# Patient Record
Sex: Female | Born: 1960 | Race: Black or African American | Hispanic: No | Marital: Married | Smoking: Never smoker
Health system: Southern US, Community
[De-identification: ages and names within clinical notes are randomized; demographics above are authoritative.]

## PROBLEM LIST (undated history)

## (undated) DIAGNOSIS — C801 Malignant (primary) neoplasm, unspecified: Secondary | ICD-10-CM

## (undated) DIAGNOSIS — T7840XA Allergy, unspecified, initial encounter: Secondary | ICD-10-CM

## (undated) HISTORY — DX: Allergy, unspecified, initial encounter: T78.40XA

## (undated) HISTORY — DX: Malignant (primary) neoplasm, unspecified: C80.1

---

## 2008-09-25 ENCOUNTER — Emergency Department (HOSPITAL_BASED_OUTPATIENT_CLINIC_OR_DEPARTMENT_OTHER): Admission: EM | Admit: 2008-09-25 | Discharge: 2008-09-25 | Payer: Self-pay | Admitting: Emergency Medicine

## 2014-08-26 HISTORY — PX: BREAST SURGERY: SHX581

## 2014-12-05 ENCOUNTER — Encounter: Payer: Self-pay | Admitting: Physical Therapy

## 2014-12-05 ENCOUNTER — Ambulatory Visit: Payer: Medicaid Other | Attending: Hematology and Oncology | Admitting: Physical Therapy

## 2014-12-05 DIAGNOSIS — M25612 Stiffness of left shoulder, not elsewhere classified: Secondary | ICD-10-CM

## 2014-12-05 DIAGNOSIS — T7840XA Allergy, unspecified, initial encounter: Secondary | ICD-10-CM | POA: Insufficient documentation

## 2014-12-05 DIAGNOSIS — I890 Lymphedema, not elsewhere classified: Secondary | ICD-10-CM | POA: Diagnosis present

## 2014-12-05 DIAGNOSIS — M25622 Stiffness of left elbow, not elsewhere classified: Secondary | ICD-10-CM | POA: Diagnosis not present

## 2014-12-05 NOTE — Therapy (Signed)
[place], [place], [address] Phone: [phone]   Fax:  [phone]  Physical Therapy Evaluation  Patient Details  Name: Tiffany Henry MRN: [id number] Date of Birth: November 10, 1961  Encounter Date: 12/05/2014      PT End of Session - 12/05/14 1026    Visit Number 1   Number of Visits 4   PT Start Time 0935   PT Stop Time 1020   PT Time Calculation (min) 45 min   Activity Tolerance Other (comment)  Did okay, but not feeling well from chemo yesterday      Past Medical History  Diagnosis Date  . Cancer   . Allergy     sulfates    Past Surgical History  Procedure Laterality Date  . Breast surgery  08/26/14    lumpectomy    There were no vitals taken for this visit.  Visit Diagnosis:  Lymphedema of upper extremity - Plan: PT plan of care cert/re-cert  Stiffness of shoulder joint, left - Plan: PT plan of care cert/re-cert  Stiffness of elbow joint, left - Plan: PT plan of care cert/re-cert      Subjective Assessment - 12/05/14 0944    Symptoms Swelling in left arm, limited ROM and use of left arm.   Pertinent History Left breast cancer with lumpectomy 08/26/14; currently on chemotherapy (on 3rd of 8 rounds) and will have radiation.   Currently in Pain? No/denies          [facility] PT Assessment - 12/05/14 0001    Assessment   Medical Diagnosis left breast cancer   Precautions   Precautions Other (comment)  cancer precautions; current chemotherapy   Restrictions   Weight Bearing Restrictions No   Balance Screen   Has the patient fallen in the past 6 months No   Has the patient had a decrease in activity level because of a fear of falling?  No   Is the patient reluctant to leave their home because of a fear of falling?  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   AROM   Overall AROM  Deficits   Right Shoulder Extension 60 Degrees   Right Shoulder Flexion 173 Degrees   Right Shoulder ABduction 180 Degrees   Right Shoulder Internal Rotation 70 Degrees   Right Shoulder External Rotation 90 Degrees   Left Shoulder Extension 45 Degrees   Left Shoulder Flexion 96 Degrees   Left Shoulder ABduction 88 Degrees   Left Shoulder Internal Rotation 52 Degrees   Left Shoulder External Rotation 65 Degrees  approx. in sitting   Left Elbow Flexion --  WNL   Left Elbow Extension -40  visible and palpable cording at left elbow with extension           LYMPHEDEMA/ONCOLOGY QUESTIONNAIRE - 12/05/14 0957    Right Upper Extremity Lymphedema  15 cm Proximal to Olecranon Process 36 cm   10 cm Proximal to Olecranon Process 34.3 cm   Olecranon Process 28.2 cm   15 cm Proximal to Ulnar Styloid Process 28 cm   10 cm Proximal to Ulnar Styloid Process 24.6 cm   Just Proximal to Ulnar Styloid Process 18.5 cm   Across Hand at [employer] 21.3 cm   At [place] of 2nd Digit 6.8 cm   Left Upper Extremity Lymphedema   15 cm Proximal to Olecranon Process 36.5 cm   10 cm Proximal to Olecranon Process 35 cm   Olecranon Process 28.6 cm   15 cm Proximal to Ulnar Styloid Process 28.8 cm   10 cm Proximal to Ulnar Styloid Process 25 cm   Just Proximal to Ulnar Styloid Process 17.9 cm   Across Hand at [employer] 21.6 cm   At [place] of 2nd Digit 6.9 cm            Quick Dash - 12/05/14 0001    Open a tight or new jar Moderate difficulty   Carry a shopping bag or briefcase Moderate difficulty   Wash your back Unable   Use a knife to cut food Moderate difficulty   Recreational activities in which you take some force or impact through your arm, shoulder, or hand (golf, hammering, tennis) Unable   During the past week, to what extent has your arm, shoulder or hand problem interfered with your normal social activities with family, friends, neighbors, or groups? Modererately   During the past week, to what extent has your arm, shoulder or hand problem limited your work or other regular daily activities Modererately   Arm, shoulder, or hand pain. Moderate   Tingling (pins and needles) in your arm, shoulder, or hand Mild   Difficulty Sleeping Moderate difficulty   DASH Score 50 %                    PT Education - 12/05/14 1026    Education provided Yes   Education Details lymphedema brochure from [facility]; lymphedema risk reduction practices   Person(s) Educated Patient   Methods Handout   Comprehension Need further instruction           Short Term Clinic Goals - 12/05/14 1032    CC Short Term Goal  #1   Title see long term goals             Long Term Clinic Goals - 12/05/14 1032    CC Long Term Goal  #1   Title Pt. will be knowledgeable about lymphedema risk reduction practices, including obtaining compression garments.   Baseline no knowledge   Time 4   Period Weeks   Status New   CC Long Term Goal  #2   Title Pt. will be independent in self-manual lymph drainage.   Baseline no knowledge   Time 4   Period Weeks   Status New   CC Long Term Goal  #3   Title Pt. will be independent in HEP for left UE ROM and stretching of axillary web syndrome.   Baseline no knowledge and with significant AROM and functional limitations   Time 4   Period Weeks   Status New            Plan -  12/05/14 1027    Clinical Impression Statement Pt. s/p lumpectomy and currently in chemotherapy for left breast cancer with appearance of beginning of lymphedema  left UE; also with limited shoulder and elbow AROM that side, and visible cording at left elbow with extension.   Pt will benefit from skilled therapeutic intervention in order to improve on the following deficits Increased edema;Decreased range of motion;Decreased knowledge of precautions   Rehab Potential Excellent   Clinical Impairments Affecting Rehab Potential undergoing chemotherapy so may not feel well at times   PT Frequency Other (comment)  3 visits   PT Duration 4 weeks   PT Treatment/Interventions Manual lymph drainage;Patient/family education;ADLs/Self Care Home Management;Therapeutic exercise;Passive range of motion   PT Next Visit Plan Do manual lymph drainage for left UE and begin instruction in self-massage; left shoulder and elbow ROM and HEP.  Discuss compression garments.  Pt. is also expected to attend ABC class 12/18/14.   Recommended Other Services ABC class 12/18/14.   Consulted and Agree with Plan of Care Patient     Daytime sleeve and glove and nighttime compression garment prescription was faxed to Dr. Harlow Asa for signature today.    Problem List Patient Active Problem List   Diagnosis Date Noted  . Allergy     Aarionna Germer 12/05/2014, 10:47 AM  [place], [place], [address] Phone: [phone]   Fax:  [phone]   Serafina Royals, [place]

## 2014-12-05 NOTE — Therapy (Signed)
[place], [place], [address] Phone: [phone]   Fax:  [phone]  Physical Therapy Evaluation  Patient Details  Name: Tiffany Henry MRN: [id number] Date of Birth: [birth year]  Encounter Date: 12/05/2014      PT End of Session - 12/05/14 1026    Visit Number 1   Number of Visits 4   PT Start Time 0935   PT Stop Time 1020   PT Time Calculation (min) 45 min   Activity Tolerance Other (comment)  Did okay, but not feeling well from chemo yesterday      Past Medical History  Diagnosis Date  . Cancer   . Allergy     sulfates    Past Surgical History  Procedure Laterality Date  . Breast surgery  08/26/14    lumpectomy    There were no vitals taken for this visit.  Visit Diagnosis:  Lymphedema of upper extremity - Plan: PT plan of care cert/re-cert  Stiffness of shoulder joint, left - Plan: PT plan of care cert/re-cert  Stiffness of elbow joint, left - Plan: PT plan of care cert/re-cert      Subjective Assessment - 12/05/14 0944    Symptoms Swelling in left arm, limited ROM and use of left arm.   Pertinent History Left breast cancer with lumpectomy 08/26/14; currently on chemotherapy (on 3rd of 8 rounds) and will have radiation.   Currently in Pain? No/denies          [facility] PT Assessment - 12/05/14 0001    Assessment   Medical Diagnosis left breast cancer   Precautions   Precautions Other (comment)  cancer precautions; current chemotherapy   Restrictions   Weight Bearing Restrictions No   Balance Screen   Has the patient fallen in the past 6 months No   Has the patient had a decrease in activity level because of a fear of falling?  No   Is the patient reluctant to leave their home because of a fear of falling?  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   AROM   Overall AROM  Deficits   Right Shoulder Extension 60 Degrees   Right Shoulder Flexion 173 Degrees   Right Shoulder ABduction 180 Degrees   Right Shoulder Internal Rotation 70 Degrees   Right Shoulder External Rotation 90 Degrees   Left Shoulder Extension 45 Degrees   Left Shoulder Flexion 96 Degrees   Left Shoulder ABduction 88 Degrees   Left Shoulder Internal Rotation 52 Degrees   Left Shoulder External Rotation 65 Degrees  approx. in sitting   Left Elbow Flexion --  WNL   Left Elbow Extension -40  visible and palpable cording at left elbow with extension           LYMPHEDEMA/ONCOLOGY QUESTIONNAIRE - 12/05/14 0957    Right Upper Extremity Lymphedema  15 cm Proximal to Olecranon Process 36 cm   10 cm Proximal to Olecranon Process 34.3 cm   Olecranon Process 28.2 cm   15 cm Proximal to Ulnar Styloid Process 28 cm   10 cm Proximal to Ulnar Styloid Process 24.6 cm   Just Proximal to Ulnar Styloid Process 18.5 cm   Across Hand at [employer] 21.3 cm   At [place] of 2nd Digit 6.8 cm   Left Upper Extremity Lymphedema   15 cm Proximal to Olecranon Process 36.5 cm   10 cm Proximal to Olecranon Process 35 cm   Olecranon Process 28.6 cm   15 cm Proximal to Ulnar Styloid Process 28.8 cm   10 cm Proximal to Ulnar Styloid Process 25 cm   Just Proximal to Ulnar Styloid Process 17.9 cm   Across Hand at [employer] 21.6 cm   At [place] of 2nd Digit 6.9 cm            Quick Dash - 12/05/14 0001    Open a tight or new jar Moderate difficulty   Carry a shopping bag or briefcase Moderate difficulty   Wash your back Unable   Use a knife to cut food Moderate difficulty   Recreational activities in which you take some force or impact through your arm, shoulder, or hand (golf, hammering, tennis) Unable   During the past week, to what extent has your arm, shoulder or hand problem interfered with your normal social activities with family, friends, neighbors, or groups? Modererately   During the past week, to what extent has your arm, shoulder or hand problem limited your work or other regular daily activities Modererately   Arm, shoulder, or hand pain. Moderate   Tingling (pins and needles) in your arm, shoulder, or hand Mild   Difficulty Sleeping Moderate difficulty   DASH Score 50 %                    PT Education - 12/05/14 1026    Education provided Yes   Education Details lymphedema brochure from [facility]; lymphedema risk reduction practices   Person(s) Educated Patient   Methods Handout   Comprehension Need further instruction           Short Term Clinic Goals - 12/05/14 1032    CC Short Term Goal  #1   Title see long term goals             Long Term Clinic Goals - 12/05/14 1032    CC Long Term Goal  #1   Title Pt. will be knowledgeable about lymphedema risk reduction practices, including obtaining compression garments.   Baseline no knowledge   Time 4   Period Weeks   Status New   CC Long Term Goal  #2   Title Pt. will be independent in self-manual lymph drainage.   Baseline no knowledge   Time 4   Period Weeks   Status New   CC Long Term Goal  #3   Title Pt. will be independent in HEP for left UE ROM and stretching of axillary web syndrome.   Baseline no knowledge and with significant AROM and functional limitations   Time 4   Period Weeks   Status New            Plan -  12/05/14 1027    Clinical Impression Statement Pt. s/p lumpectomy and currently in chemotherapy for left breast cancer with appearance of beginning of lymphedema  left UE; also with limited shoulder and elbow AROM that side, and visible cording at left elbow with extension.   Pt will benefit from skilled therapeutic intervention in order to improve on the following deficits Increased edema;Decreased range of motion;Decreased knowledge of precautions   Rehab Potential Excellent   Clinical Impairments Affecting Rehab Potential undergoing chemotherapy so may not feel well at times   PT Frequency Other (comment)  3 visits   PT Duration 4 weeks   PT Treatment/Interventions Manual lymph drainage;Patient/family education;ADLs/Self Care Home Management;Therapeutic exercise;Passive range of motion   PT Next Visit Plan Do manual lymph drainage for left UE and begin instruction in self-massage; left shoulder and elbow ROM and HEP.  Discuss compression garments.  Pt. is also expected to attend ABC class 12/18/14.   Recommended Other Services ABC class 12/18/14.   Consulted and Agree with Plan of Care Patient         Problem List Patient Active Problem List   Diagnosis Date Noted  . Allergy     Siriah Treat 12/05/2014, 10:41 AM  [place], [place], [address] Phone: [phone]   Fax:  [phone]   Serafina Royals, [place]

## 2014-12-14 ENCOUNTER — Ambulatory Visit: Payer: Medicaid Other | Admitting: Physical Therapy

## 2014-12-14 DIAGNOSIS — I890 Lymphedema, not elsewhere classified: Secondary | ICD-10-CM | POA: Diagnosis not present

## 2014-12-14 DIAGNOSIS — M25612 Stiffness of left shoulder, not elsewhere classified: Secondary | ICD-10-CM

## 2014-12-14 DIAGNOSIS — M25622 Stiffness of left elbow, not elsewhere classified: Secondary | ICD-10-CM

## 2014-12-14 NOTE — Therapy (Signed)
[place], [place], [address] Phone: [phone]   Fax:  [phone]  Physical Therapy Treatment  Patient Details  Name: Tiffany Henry MRN: [id number] Date of Birth: 1961-08-26  Encounter Date: 12/14/2014      PT End of Session - 12/14/14 1221    Visit Number 2   Number of Visits 4   PT Start Time 8242   PT Stop Time 1147   PT Time Calculation (min) 42 min      Past Medical History  Diagnosis Date  . Cancer   . Allergy     sulfates    Past Surgical History  Procedure Laterality Date  . Breast surgery  08/26/14    lumpectomy    There were no vitals taken for this visit.  Visit Diagnosis:  Lymphedema of upper extremity  Stiffness of shoulder joint, left  Stiffness of elbow joint, left      Subjective Assessment - 12/14/14 1212    Symptoms Pain in left medial eblow with cording palpable.  Pain goes down into hand at 5th finger side and also at thenar space.   Currently in Pain? Yes  no pain at rest, but much pain with range of motion of left arm   Pain Score --  did not rate....lots of facial grimacing   Pain Location Hand   Pain Orientation Left;Medial   Pain Descriptors / Indicators Sharp;Shooting;Tightness   Pain Type Acute pain   Pain Radiating Towards hand   Pain Onset 1 to 4 weeks ago   Pain Frequency Intermittent     Manual lymph drainage in supine as follows: short neck, right axillary nodes, left inguinal nodes, superficial and deep abdominals; anterior inter-axillary anastamoses, left axillo-inguinal anastamoses. Left upper extremity from fingers and dorsal hand to lateral shoulder redirecting along pathways.  AAROM to left shoulder in conventional and diagonal patterns incorporating ROM of wrist and forearm as able Pt unable to tolerate much range of [place] of elbow, but worked on pronation and supination as well as wrist, hand and finger ROM Also did backward  shoulder rolls in sitting         Short Term Clinic Goals - 12/05/14 1032    CC Short Term Goal  #1   Title see long term goals             Long Term Clinic Goals - 12/14/14 1226    CC Long Term Goal  #1   Title Pt. will be knowledgeable about lymphedema risk reduction practices, including obtaining compression garments.   Time 4   Period Weeks   Status On-going   CC Long Term Goal  #2   Title Pt. will be independent in self-manual lymph drainage.   Time 4   Period Weeks   Status On-going   CC Long Term Goal  #3   Title Pt. will be independent in HEP for left UE ROM and stretching of axillary web syndrome.   Time 4   Status On-going            Plan - 12/14/14 1222    Clinical Impression Statement Pt continues to have painful cording and edema in left arm.  She was able to tolerate manual lymph drainage and participate with exercise. She appeared to tolerate Tg soft well.   PT Next Visit Plan Continue with manual lymph drainage and exercise.  Review self manual lymph drainage.  Initiate compression garments from [employer] if  tolerating Tg  Soft OK        Problem List Patient Active Problem List   Diagnosis Date Noted  . Allergy    Donato Heinz. Owens Shark PT   Norwood Levo 12/14/2014, 12:27 PM  [place], [place], [address] Phone: [phone]   Fax:  [phone]

## 2014-12-14 NOTE — Patient Instructions (Addendum)
AROM of left shoulder, forearm, hand and fingers Supine cane exercises Pt issued tgSoft to left arm to wear for symptomatic relief as needed Adela Lank Training DVD for pt to watch and practice self manual lymph drainage at home over the weekend She knows to return it

## 2014-12-19 ENCOUNTER — Ambulatory Visit: Payer: Medicaid Other | Attending: Hematology and Oncology | Admitting: Physical Therapy

## 2014-12-19 DIAGNOSIS — M25622 Stiffness of left elbow, not elsewhere classified: Secondary | ICD-10-CM | POA: Insufficient documentation

## 2014-12-19 DIAGNOSIS — M25612 Stiffness of left shoulder, not elsewhere classified: Secondary | ICD-10-CM | POA: Insufficient documentation

## 2014-12-19 DIAGNOSIS — I890 Lymphedema, not elsewhere classified: Secondary | ICD-10-CM | POA: Insufficient documentation

## 2014-12-19 NOTE — Patient Instructions (Signed)
Inferior Capsule Stick Stretch I   Stand or sit, dowel in palm of arm to be stretched. Other arm, holding dowel in front of body, pushes outward and upward until arm being stretched is as high to the side as possible. Hold __5_ seconds. Repeat _5-8__ times per session. Do _2__ sessions per day.  Copyright  VHI. All rights reserved.  Flexors Stick Stretch I   Stand or sit, dowel in palm of arm to be stretched. Other arm, holding dowel at side and behind body, pushes arm being stretched forward and upward until straight over head. Hold _5__ seconds. Repeat __5-8_ times per session. Do _2__ sessions per day.  Copyright  VHI. All rights reserved.  Internal Rotator Cuff Stick Stretch III, Standing (Passive)   Stand or sit, dowel in palm of arm to be stretched. Other arm, holding dowel at side and behind body, pushes in a large diagonal arc upward and backward until arm being stretched is in 90/90 of shoulder abduction and elbow flexion. Hold __5_ seconds. Repeat __5-8_ times per session. Do _2__ sessions per day.  Copyright  VHI. All rights reserved.

## 2014-12-19 NOTE — Therapy (Addendum)
[place], [place], [address] Phone: [phone]   Fax:  [phone]  Physical Therapy Treatment  Patient Details  Name: Tiffany Henry MRN: [id number] Date of Birth: 09-25-1961  Encounter Date: 12/19/2014    Past Medical History  Diagnosis Date  . Cancer   . Allergy     sulfates    Past Surgical History  Procedure Laterality Date  . Breast surgery  08/26/14    lumpectomy    There were no vitals taken for this visit.  Visit Diagnosis:  Lymphedema of upper extremity  Stiffness of shoulder joint, left  Stiffness of elbow joint, left    Cut and fitted new TG soft sleeve, one that is snugger, and instructed in its use.  Reviewed HEP she is currently doing:  Diona Foley squeezes for hand; using dowel for bilateral shoulder flexion; rotating left shoulder and abducting arm.  Added unilateral left shoulder flexion and abduction with dowel as well as left shoulder abduction with external rotation 5 count holds x 5 each.  Educated patient about lymphedema risk reduction practices using National [employer]ymp[employer]ema [employer] guidelines handout.  Re-faxed compression garment prescription to Dr. Harlow Asa.                     Short Term Clinic Goals - 12/05/14 1032    CC Short Term Goal  #1   Title see long term goals             Long Term Clinic Goals - 12/19/14 1722    CC Long Term Goal  #1   Status On-going   CC Long Term Goal  #2   Status On-going   CC Long Term Goal  #3   Status On-going            Problem List Patient Active Problem List   Diagnosis Date Noted  . Allergy     Janell Keeling 02/21/2016, 9:45 PM  [place], [place], [address] Phone: [phone]   Fax:  [phone]  [place], [place] SUMMARY  Visits from Start of Care: 3  Current functional  level related to goals / functional outcomes: Goals were partially met.  Patient did not return for final visit.   Remaining deficits: Probably none.  Plan was for patient to return for one more visit to firm up her knowledge and independence, but she did not come for that.   Education / Equipment: HEP, self-care.  Plan: Patient agrees to discharge.  Patient goals were partially met. Patient is being discharged due to not returning since the last visit.  ?????    Serafina Royals, PT 02/21/2016 9:45 PM

## 2014-12-20 ENCOUNTER — Ambulatory Visit: Payer: Medicaid Other

## 2015-01-01 ENCOUNTER — Encounter: Payer: Medicaid Other | Admitting: Physical Therapy

## 2015-01-03 ENCOUNTER — Ambulatory Visit: Payer: Medicaid Other | Admitting: Physical Therapy

## 2015-01-08 ENCOUNTER — Ambulatory Visit: Payer: Medicaid Other | Admitting: Physical Therapy

## 2015-01-10 ENCOUNTER — Ambulatory Visit: Payer: Medicaid Other | Admitting: Physical Therapy

## 2015-05-28 DIAGNOSIS — C50412 Malignant neoplasm of upper-outer quadrant of left female breast: Secondary | ICD-10-CM | POA: Insufficient documentation

## 2015-08-22 DIAGNOSIS — Z5181 Encounter for therapeutic drug level monitoring: Secondary | ICD-10-CM | POA: Insufficient documentation

## 2015-08-22 DIAGNOSIS — Z79810 Long term (current) use of selective estrogen receptor modulators (SERMs): Secondary | ICD-10-CM

## 2018-12-12 ENCOUNTER — Other Ambulatory Visit: Payer: Self-pay

## 2018-12-12 ENCOUNTER — Emergency Department (HOSPITAL_BASED_OUTPATIENT_CLINIC_OR_DEPARTMENT_OTHER): Payer: BLUE CROSS/BLUE SHIELD

## 2018-12-12 ENCOUNTER — Encounter (HOSPITAL_BASED_OUTPATIENT_CLINIC_OR_DEPARTMENT_OTHER): Payer: Self-pay | Admitting: Emergency Medicine

## 2018-12-12 ENCOUNTER — Emergency Department (HOSPITAL_BASED_OUTPATIENT_CLINIC_OR_DEPARTMENT_OTHER)
Admission: EM | Admit: 2018-12-12 | Discharge: 2018-12-12 | Disposition: A | Discharge status: Home / Self Care | Payer: BLUE CROSS/BLUE SHIELD | Attending: Emergency Medicine | Admitting: Emergency Medicine

## 2018-12-12 DIAGNOSIS — Z79899 Other long term (current) drug therapy: Secondary | ICD-10-CM | POA: Diagnosis not present

## 2018-12-12 DIAGNOSIS — Z7984 Long term (current) use of oral hypoglycemic drugs: Secondary | ICD-10-CM | POA: Insufficient documentation

## 2018-12-12 DIAGNOSIS — M25561 Pain in right knee: Secondary | ICD-10-CM | POA: Diagnosis present

## 2018-12-12 MED ORDER — HYDROCODONE-ACETAMINOPHEN 5-325 MG PO TABS: 1.0000 | ORAL_TABLET | Freq: Four times a day (QID) | ORAL | 0 refills | Status: DC | PRN

## 2018-12-12 MED ORDER — HYDROCODONE-ACETAMINOPHEN 5-325 MG PO TABS
1.0000 | ORAL_TABLET | Freq: Once | ORAL | Status: AC
  Administered 2018-12-12: 1 via ORAL
  Filled 2018-12-12: qty 1

## 2018-12-12 NOTE — Discharge Instructions (Addendum)

## 2018-12-12 NOTE — ED Notes (Addendum)
Pt states she was walking up stairs at church at approx 1030 today, and when she stepped up with her righ leg it felt weak and gave out. Pt denies twisting leg. She states she had gotten an xray yesterday related to bilateral knee pain. She states she has been walking approx 3 times /week and has noticed an increase in her leg/knee discomfort since walking. Pt denies taking any medications for pain today

## 2018-12-12 NOTE — ED Triage Notes (Signed)
Has had R knee pain for several days, she had an xray at her dr yesterday. Today she was stepping up onto the curb when she felt a pop and now cannot put weight on it.

## 2018-12-12 NOTE — ED Provider Notes (Signed)
[facility] EMERGENCY DEPARTMENT Provider Note   CSN: [id number] Arrival date & time: 12/12/18  1209     History   Chief Complaint Chief Complaint  Patient presents with  . Knee Pain    HPI Tiffany Henry is a 56 y.o. female who presents today for evaluation of right-sided knee pain.  She reports that her knee has been hurting for the past few weeks.  Today she was at church and she stepped up with her right leg and felt a pop and says that it gave out.  She has not taken any medicines for pain today.  She says that it hurts too much to put weight on her knee since she felt a pop.  She denies any numbness or tingling.  HPI  Past Medical History:  Diagnosis Date  . Allergy    sulfates  . Cancer [facility])     Patient Active Problem List   Diagnosis Date Noted  . Allergy     Past Surgical History:  Procedure Laterality Date  . BREAST SURGERY  08/26/14   lumpectomy     OB History   No obstetric history on file.      Home Medications    Prior to Admission medications   Medication Sig Start Date End Date Taking? Authorizing Provider  alendronate (FOSAMAX) 70 MG tablet Take by mouth. 09/12/18  Yes [provider]  empagliflozin (JARDIANCE) 10 MG TABS tablet  10/20/18  Yes [provider]  exemestane (AROMASIN) 25 MG tablet Take by mouth. 10/22/18  Yes [provider]  letrozole ([place]) 2.5 MG tablet TAKE ONE TABLET BY MOUTH ONCE DAILY 08/06/17  Yes [provider]  metFORMIN (GLUCOPHAGE) 1000 MG tablet TAKE 1 TABLET BY MOUTH TWICE DAILY 09/12/18  Yes [provider]  HYDROcodone-acetaminophen (NORCO/VICODIN) 5-325 MG tablet Take 1 tablet by mouth every 6 (six) hours as needed for severe pain. 12/12/18   Lorin Glass, PA-C  ondansetron (ZOFRAN-ODT) 8 MG disintegrating tablet Take 8 mg by mouth every 8 (eight) hours as needed for nausea or vomiting.    [provider]  prochlorperazine (COMPAZINE) 10 MG  tablet Take 10 mg by mouth every 6 (six) hours as needed for nausea or vomiting.    [provider]    Family History No family history on file.  Social History Social History   Tobacco Use  . Smoking status: Never Smoker  . Smokeless tobacco: Never Used  Substance Use Topics  . Alcohol use: Not Currently  . Drug use: Not on file     Allergies   Sulfa antibiotics   Review of Systems Review of Systems  Constitutional: Negative for chills and fever.  Musculoskeletal: Negative for back pain.       Pain in right knee.   Neurological: Negative for weakness and numbness.  All other systems reviewed and are negative.    Physical Exam Updated Vital Signs BP (!) 146/80 (BP Location: Right Arm)   Pulse 70   Temp 98.4 F (36.9 C) (Oral)   Resp 16   Ht 5\' 4"  (1.626 m)   Wt 95.3 kg   SpO2 98%   BMI 36.05 kg/m   Physical Exam Vitals signs and nursing note reviewed.  Constitutional:      General: She is not in acute distress.    Appearance: She is not ill-appearing.  HENT:     Head: Normocephalic.  Cardiovascular:     Rate and Rhythm: Normal rate.  Comments: Right foot 2+ DP/PT pulse. Pulmonary:     Effort: Pulmonary effort is normal. No respiratory distress.  Musculoskeletal:     Comments: Right knee has mild tenderness to palpation over the medial aspect.  There is no obvious joint effusion.  Range of motion limited secondary to pain.  There is no tenderness to palpation over right posterior calf.    Skin:    Comments: No abnormal redness, warmth, or wounds present over right knee.  Neurological:     Mental Status: She is alert.     Comments: Awake and alert, interacts appropriately. Sensation intact to right foot.      ED Treatments / Results  Labs (all labs ordered are listed, but only abnormal results are displayed) Labs Reviewed - No data to display  EKG None  Radiology Dg Knee Complete 4 Views Right  Result Date: 12/12/2018 CLINICAL  DATA:  Patient with injury to the right knee. Unable to bear weight EXAM: RIGHT KNEE - COMPLETE 4+ VIEW COMPARISON:  None. FINDINGS: Normal anatomic alignment. No evidence for acute fracture or dislocation. Regional soft tissues unremarkable. IMPRESSION: No acute osseous abnormality. Electronically Signed   By: Lovey Newcomer M.D.   On: 12/12/2018 13:03    Procedures Procedures (including critical care time)  Medications Ordered in ED Medications  HYDROcodone-acetaminophen (NORCO/VICODIN) 5-325 MG per tablet 1 tablet (1 tablet Oral Given 12/12/18 1454)     Initial Impression / Assessment and Plan / ED Course  I have reviewed the triage vital signs and the nursing notes.  Pertinent labs & imaging results that were available during my care of the patient were reviewed by me and considered in my medical decision making (see chart for details).    [place] Presents with right knee pain after she took a step and felt a pop in her right knee consistent with a knee sprain/strain.  The affected knee is tender on the medial aspect.  X-rays were obtained with out acute abnormality. The skin is intact to the knee.  The foot is warm and well perfused with intact sensation.  Motor function is limited secondary to pain.  Patient given instructions for OTC pain medication, knee immobilizer, and appropriate hydrocodone use.  Patient advised to follow up with PCP or orthopedics if symptoms persist for longer than one week.  Patient was given the option to ask questions, all of which were answered to the best of my ability.  Patient is agreeable for discharge.    Final Clinical Impressions(s) / ED Diagnoses   Final diagnoses:  Acute pain of right knee    ED Discharge Orders         Ordered    HYDROcodone-acetaminophen (NORCO/VICODIN) 5-325 MG tablet  Every 6 hours PRN     12/12/18 1444           Ollen Gross 12/12/18 2001    Margette Fast, MD 12/13/18 1945

## 2018-12-16 ENCOUNTER — Ambulatory Visit (HOSPITAL_BASED_OUTPATIENT_CLINIC_OR_DEPARTMENT_OTHER)
Admission: RE | Admit: 2018-12-16 | Discharge: 2018-12-16 | Disposition: A | Discharge status: Home / Self Care | Payer: BLUE CROSS/BLUE SHIELD | Source: Ambulatory Visit | Attending: Family Medicine | Admitting: Family Medicine

## 2018-12-16 ENCOUNTER — Other Ambulatory Visit: Payer: BLUE CROSS/BLUE SHIELD

## 2018-12-16 ENCOUNTER — Encounter: Payer: Self-pay | Admitting: Family Medicine

## 2018-12-16 ENCOUNTER — Ambulatory Visit
Admission: RE | Admit: 2018-12-16 | Discharge: 2018-12-16 | Disposition: A | Discharge status: Home / Self Care | Payer: BLUE CROSS/BLUE SHIELD | Source: Ambulatory Visit | Attending: Family Medicine | Admitting: Family Medicine

## 2018-12-16 ENCOUNTER — Ambulatory Visit (INDEPENDENT_AMBULATORY_CARE_PROVIDER_SITE_OTHER): Payer: BLUE CROSS/BLUE SHIELD | Admitting: Family Medicine

## 2018-12-16 VITALS — BP 121/74 | HR 80 | Ht 64.0 in | Wt 210.0 lb

## 2018-12-16 DIAGNOSIS — M25561 Pain in right knee: Secondary | ICD-10-CM

## 2018-12-16 DIAGNOSIS — M25551 Pain in right hip: Secondary | ICD-10-CM

## 2018-12-16 MED ORDER — DICLOFENAC SODIUM 75 MG PO TBEC: 75.0000 mg | DELAYED_RELEASE_TABLET | Freq: Two times a day (BID) | ORAL | 1 refills | Status: AC

## 2018-12-16 NOTE — Patient Instructions (Signed)
You have a meniscus tear and I'm also concerned about your ACL. Switch to hinged knee brace. Use walker as needed. Get x-rays of your hip as you leave today - we will call you with the results. Take diclofenac 75mg  twice a day with food for pain and inflammation - don't take aleve or ibuprofen. Take tylenol 500mg  1-2 tabs three times a day as needed. Topical aspercreme or biofreeze up to 4 times a day, salon pas patches may help as well. We will go ahead with an MRI of your knee to further assess. Follow up, next steps will depend on the MRI.

## 2018-12-16 NOTE — Addendum Note (Signed)
Addended by: Sherrie George F on: 12/16/2018 04:04 PM   Modules accepted: Orders

## 2018-12-16 NOTE — Progress Notes (Signed)
[facility]P: [facility]  Subjective:   HPI: Patient is a 58 y.o. female here for right knee injury.  Patient reports on December 29 she was at church stepping up when she felt a loud pop the posterior aspect of her right knee. She reports feeling an associated numbness from her hip down to her foot but a throbbing pain localized at the knee and she was unable to bear weight. Pain level is still at a 10 out of 10 and sharp, throbbing. Has been wearing immobilizer and taking ibuprofen. Only a little localized numbness behind right knee. No skin changes. + swelling. No prior injuries to right knee or leg. Has in past couple days developed pain in right hip.  Past Medical History:  Diagnosis Date  . Allergy    sulfates  . Cancer [facility])     Current Outpatient Medications on File Prior to Visit  Medication Sig Dispense Refill  . clindamycin (CLEOCIN T) 1 % external solution     . clobetasol (TEMOVATE) 0.05 % external solution     . alendronate (FOSAMAX) 70 MG tablet Take by mouth.    . empagliflozin (JARDIANCE) 10 MG TABS tablet     . exemestane (AROMASIN) 25 MG tablet Take by mouth.    Marland Kitchen HYDROcodone-acetaminophen (NORCO/VICODIN) 5-325 MG tablet Take 1 tablet by mouth every 6 (six) hours as needed for severe pain. 3 tablet 0  . letrozole (FEMARA) 2.5 MG tablet TAKE ONE TABLET BY MOUTH ONCE DAILY    . metFORMIN (GLUCOPHAGE) 1000 MG tablet TAKE 1 TABLET BY MOUTH TWICE DAILY    . ondansetron (ZOFRAN-ODT) 8 MG disintegrating tablet Take 8 mg by mouth every 8 (eight) hours as needed for nausea or vomiting.    . prochlorperazine (COMPAZINE) 10 MG tablet Take 10 mg by mouth every 6 (six) hours as needed for nausea or vomiting.     No current facility-administered medications on file prior to visit.     Past Surgical History:  Procedure Laterality Date  . BREAST SURGERY  08/26/14   lumpectomy    Allergies  Allergen Reactions  . Sulfa Antibiotics   . Tape     Can use paper tape     Social History   Socioeconomic History  . Marital status: Married    Spouse name: Not on file  . Number of children: Not on file  . Years of education: Not on file  . Highest education level: Not on file  Occupational History  . Not on file  Social Needs  . Financial resource strain: Not on file  . Food insecurity:    Worry: Not on file    Inability: Not on file  . Transportation needs:    Medical: Not on file    Non-medical: Not on file  Tobacco Use  . Smoking status: Never Smoker  . Smokeless tobacco: Never Used  Substance and Sexual Activity  . Alcohol use: Not Currently  . Drug use: Not on file  . Sexual activity: Not on file  Lifestyle  . Physical activity:    Days per week: Not on file    Minutes per session: Not on file  . Stress: Not on file  Relationships  . Social connections:    Talks on phone: Not on file    Gets together: Not on file    Attends religious service: Not on file    Active member of club or organization: Not on file    Attends meetings of clubs or  organizations: Not on file    Relationship status: Not on file  . Intimate partner violence:    Fear of current or ex partner: Not on file    Emotionally abused: Not on file    Physically abused: Not on file    Forced sexual activity: Not on file  Other Topics Concern  . Not on file  Social History Narrative  . Not on file    History reviewed. No pertinent family history.  BP 121/74   Pulse 80   Ht 5\' 4"  (1.626 m)   Wt 210 lb (95.3 kg)   BMI 36.05 kg/m   Review of Systems: See HPI above.     Objective:  Physical Exam:  Gen: NAD, comfortable in exam room  Right knee: Mild effusion.  No other gross deformity, ecchymoses. TTP both joint lines, post patellar facets. Full extension.  Only able to flex to 40 degrees.  Strength grossly normal in limited motion. Unable to position for drawers.  Could not tolerate levers.  1+ lachmans.  Negative valgus/varus testing. Positive  mcmurrays, apleys.  Pain but no instability with patellar apprehension. NV intact distally.  Left knee: No deformity. FROM with 5/5 strength. No tenderness to palpation. NVI distally.  Right hip: No gross deformity. Negative logroll.   Assessment & Plan:  1. Right knee injury - independently reviewed radiographs and no abnormalities.  Exam concerning for at least meniscus tear and contusions.  Some laxity on lachman and unable to position/tolerate other ACL tests.  Will go ahead with MRI.  Switch to hinged knee brace.  Walker if needed.  Diclofenac, tylenol, topical medications.  Radiographs of hip independently reviewed and no abnormalities - pain here is compensatory.

## 2019-01-19 ENCOUNTER — Ambulatory Visit (INDEPENDENT_AMBULATORY_CARE_PROVIDER_SITE_OTHER): Payer: BLUE CROSS/BLUE SHIELD | Admitting: Family Medicine

## 2019-01-19 ENCOUNTER — Encounter: Payer: Self-pay | Admitting: Family Medicine

## 2019-01-19 VITALS — BP 134/82 | HR 87 | Ht 64.0 in | Wt 208.0 lb

## 2019-01-19 DIAGNOSIS — M25561 Pain in right knee: Secondary | ICD-10-CM | POA: Diagnosis not present

## 2019-01-19 NOTE — Patient Instructions (Signed)
You're doing great! Take the diclofenac as needed. Do physical therapy and transition when tolerated to home exercise program. Follow up with me in 6 weeks or as needed.

## 2019-01-21 ENCOUNTER — Encounter: Payer: Self-pay | Admitting: Family Medicine

## 2019-01-21 NOTE — Progress Notes (Signed)
PCP: [facility]  Subjective:   HPI: Patient is a 58 y.o. female here for right knee injury.  1/2: Patient reports on December 29 she was at church stepping up when she felt a loud pop the posterior aspect of her right knee. She reports feeling an associated numbness from her hip down to her foot but a throbbing pain localized at the knee and she was unable to bear weight. Pain level is still at a 10 out of 10 and sharp, throbbing. Has been wearing immobilizer and taking ibuprofen. Only a little localized numbness behind right knee. No skin changes. + swelling. No prior injuries to right knee or leg. Has in past couple days developed pain in right hip.  2/5: Patient reports that she is improving well with physical therapy and taking diclofenac as needed. Her pain level is only a 3 out of 10 and more dull. No skin changes or numbness.  Past Medical History:  Diagnosis Date  . Allergy    sulfates  . Cancer [facility])     Current Outpatient Medications on File Prior to Visit  Medication Sig Dispense Refill  . alendronate (FOSAMAX) 70 MG tablet Take by mouth.    . clindamycin (CLEOCIN T) 1 % external solution     . clobetasol (TEMOVATE) 0.05 % external solution     . diclofenac (VOLTAREN) 75 MG EC tablet Take 1 tablet (75 mg total) by mouth 2 (two) times daily. 60 tablet 1  . empagliflozin (JARDIANCE) 10 MG TABS tablet     . exemestane (AROMASIN) 25 MG tablet Take by mouth.    Marland Kitchen HYDROcodone-acetaminophen (NORCO/VICODIN) 5-325 MG tablet Take 1 tablet by mouth every 6 (six) hours as needed for severe pain. 3 tablet 0  . letrozole (FEMARA) 2.5 MG tablet TAKE ONE TABLET BY MOUTH ONCE DAILY    . metFORMIN (GLUCOPHAGE) 1000 MG tablet TAKE 1 TABLET BY MOUTH TWICE DAILY    . ondansetron (ZOFRAN-ODT) 8 MG disintegrating tablet Take 8 mg by mouth every 8 (eight) hours as needed for nausea or vomiting.    . prochlorperazine (COMPAZINE) 10 MG tablet Take 10 mg by mouth every 6 (six)  hours as needed for nausea or vomiting.     No current facility-administered medications on file prior to visit.     Past Surgical History:  Procedure Laterality Date  . BREAST SURGERY  08/26/14   lumpectomy    Allergies  Allergen Reactions  . Sulfa Antibiotics   . Tape     Can use paper tape    Social History   Socioeconomic History  . Marital status: Married    Spouse name: Not on file  . Number of children: Not on file  . Years of education: Not on file  . Highest education level: Not on file  Occupational History  . Not on file  Social Needs  . Financial resource strain: Not on file  . Food insecurity:    Worry: Not on file    Inability: Not on file  . Transportation needs:    Medical: Not on file    Non-medical: Not on file  Tobacco Use  . Smoking status: Never Smoker  . Smokeless tobacco: Never Used  Substance and Sexual Activity  . Alcohol use: Not Currently  . Drug use: Not on file  . Sexual activity: Not on file  Lifestyle  . Physical activity:    Days per week: Not on file    Minutes per session: Not on  file  . Stress: Not on file  Relationships  . Social connections:    Talks on phone: Not on file    Gets together: Not on file    Attends religious service: Not on file    Active member of club or organization: Not on file    Attends meetings of clubs or organizations: Not on file    Relationship status: Not on file  . Intimate partner violence:    Fear of current or ex partner: Not on file    Emotionally abused: Not on file    Physically abused: Not on file    Forced sexual activity: Not on file  Other Topics Concern  . Not on file  Social History Narrative  . Not on file    History reviewed. No pertinent family history.  BP 134/82   Pulse 87   Ht 5\' 4"  (1.626 m)   Wt 208 lb (94.3 kg)   BMI 35.70 kg/m   Review of Systems: See HPI above.     Objective:  Physical Exam:  Gen: NAD, comfortable in exam room  Right knee: No gross  deformity, ecchymoses, swelling. No TTP. FROM with 5/5 strength. Negative ant/post drawers. Negative valgus/varus testing. Negative lachmans. Negative mcmurrays, apleys, patellar apprehension. NV intact distally.   Assessment & Plan:  1. Right knee injury -secondary to medial meniscal tear.  Patient is much improved with physical therapy and diclofenac.  She will continue physical therapy and transition to home exercise program.  She will take the diclofenac as needed.  She will follow-up in 6 weeks or as needed.

## 2020-11-07 IMAGING — CR DG KNEE COMPLETE 4+V*R*
4 series · 4 of 4 positions shown · non-contrast
Comparison: None.

CLINICAL DATA: Patient with injury to the right knee. Unable to
bear weight

EXAM:
RIGHT KNEE - COMPLETE 4+ VIEW

[t knee ap right]
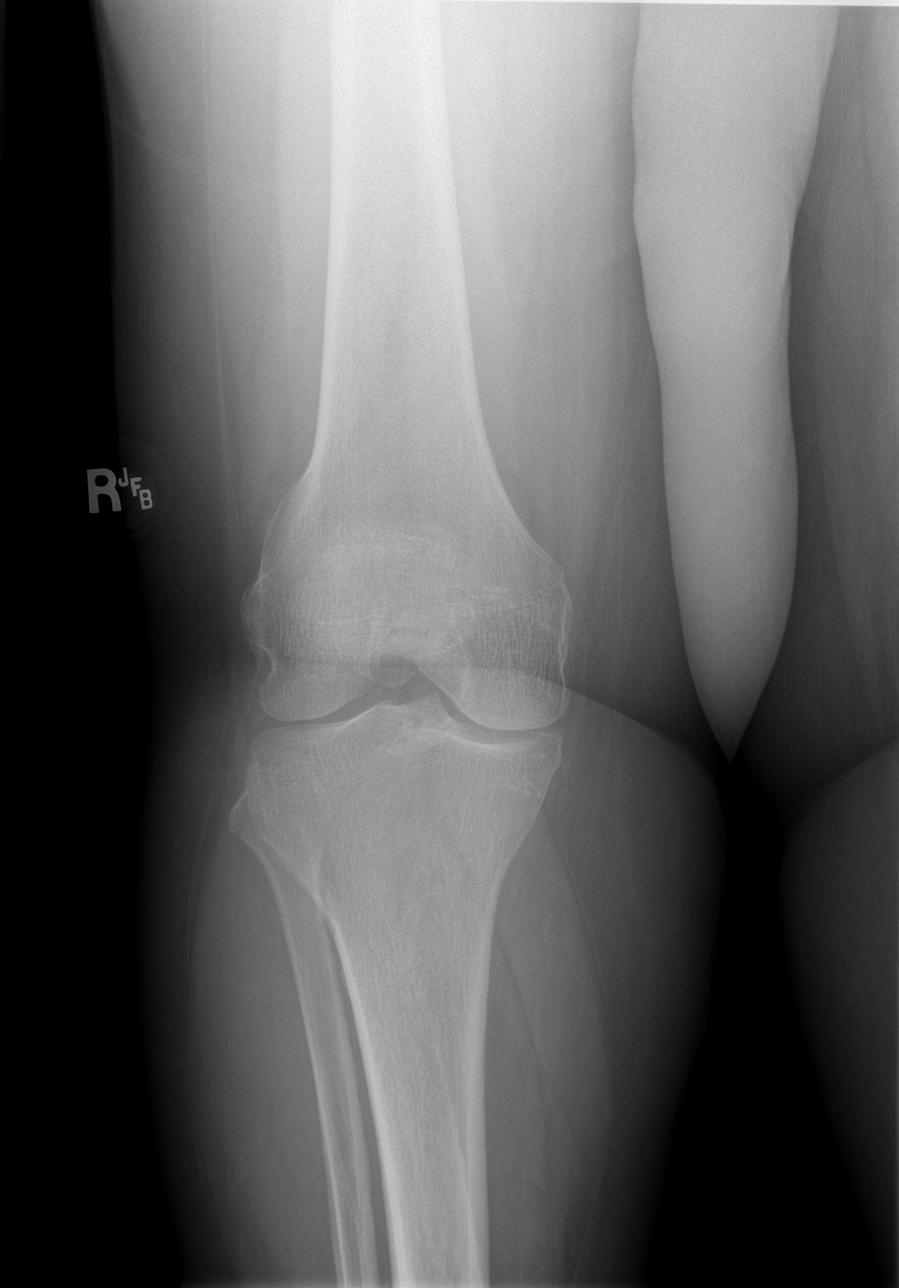

[t knee oblique right (1 of 2)]
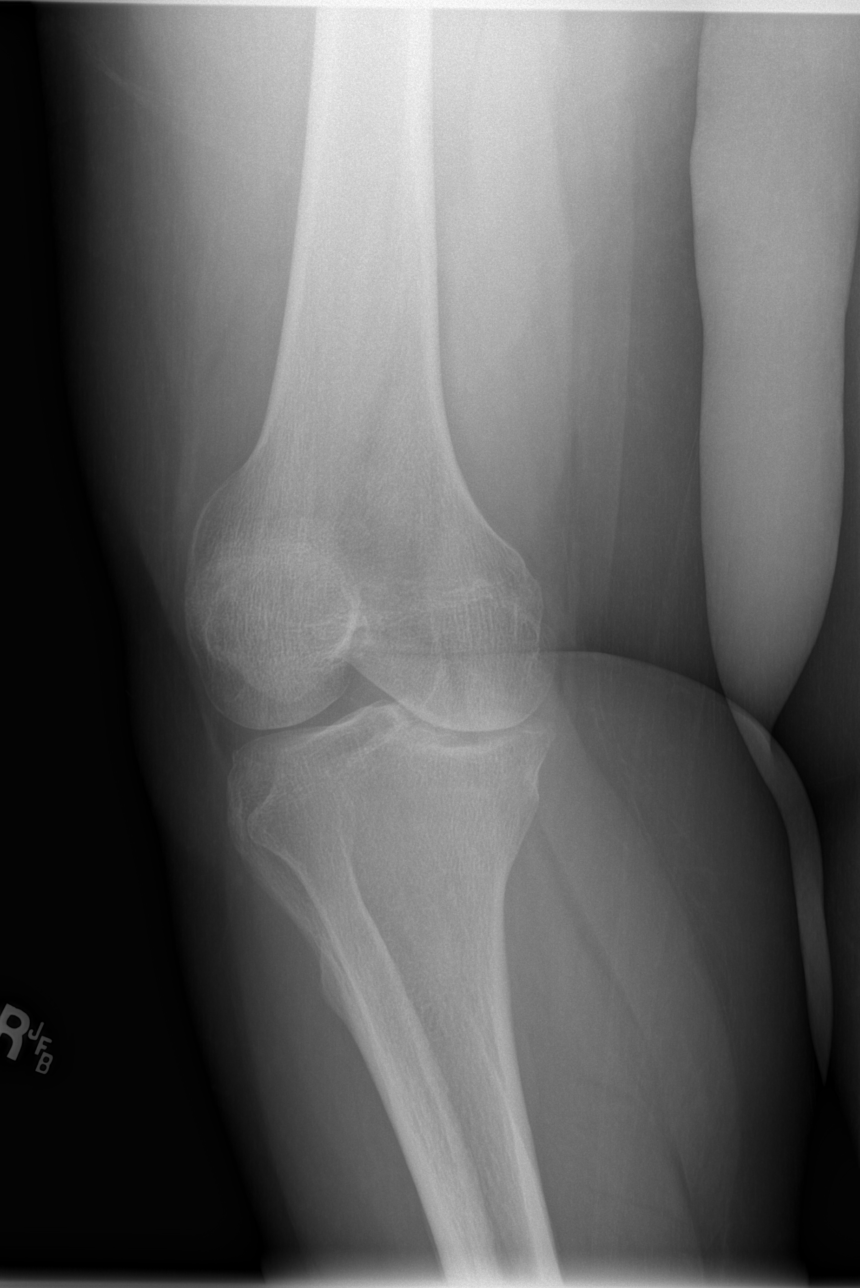

[t knee oblique right (2 of 2)]
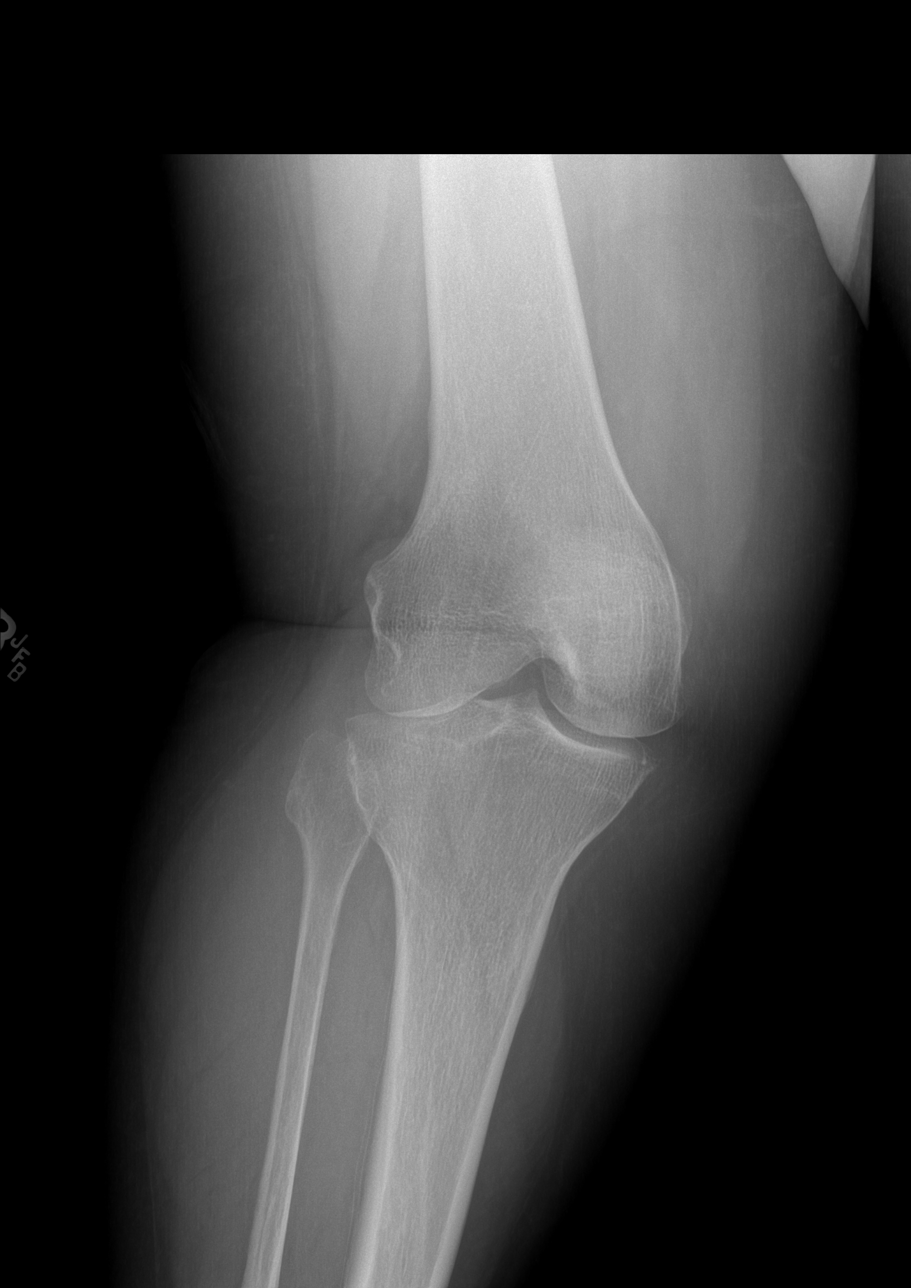

[t knee lat right]
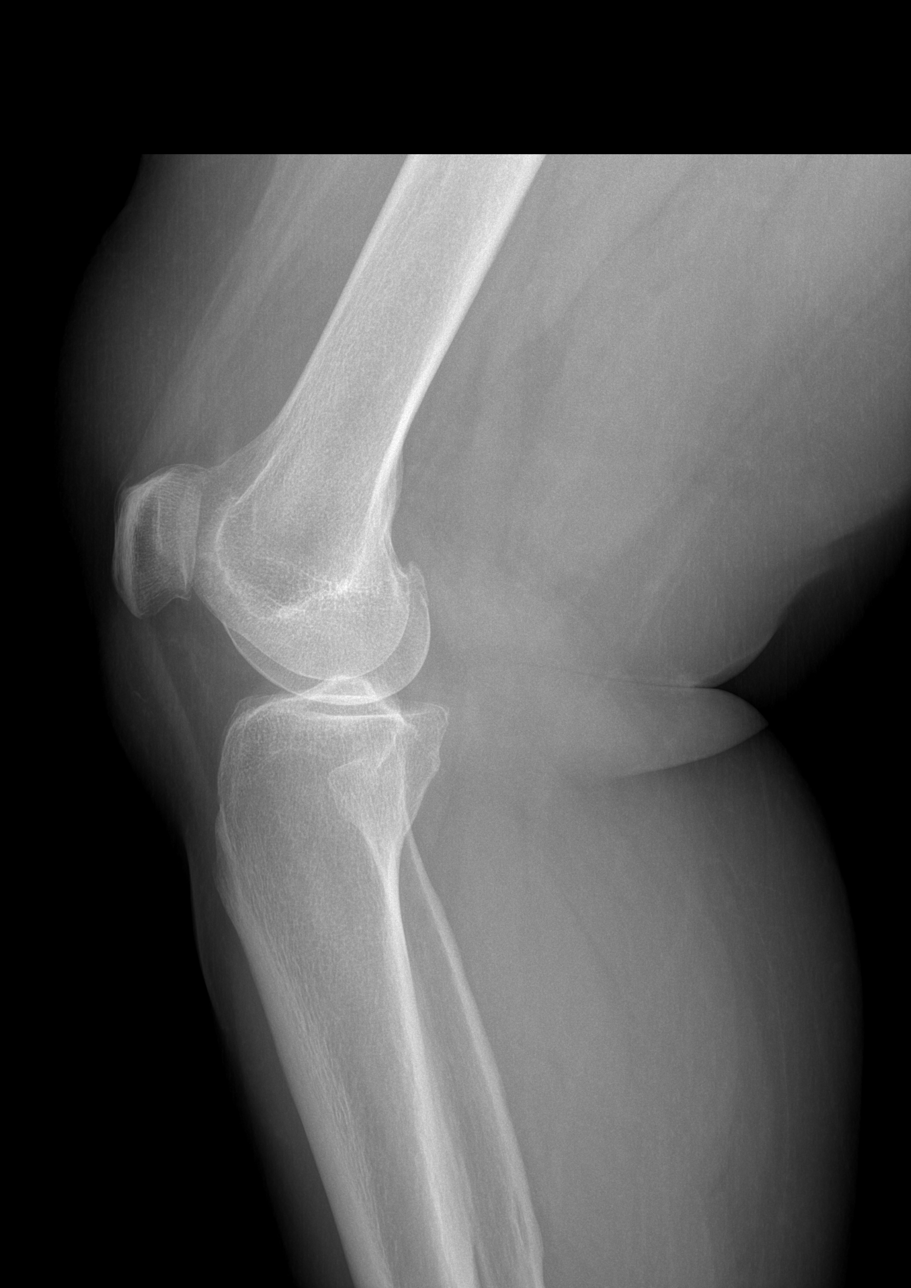

[4 of 4 positions shown; findings below may reference images not displayed]

FINDINGS: Normal anatomic alignment. No evidence for acute fracture or
dislocation. Regional soft tissues unremarkable.
IMPRESSION: No acute osseous abnormality.

## 2021-05-27 ENCOUNTER — Encounter (HOSPITAL_BASED_OUTPATIENT_CLINIC_OR_DEPARTMENT_OTHER): Payer: Self-pay | Admitting: *Deleted

## 2021-05-27 ENCOUNTER — Other Ambulatory Visit: Payer: Self-pay

## 2021-05-27 ENCOUNTER — Emergency Department (HOSPITAL_BASED_OUTPATIENT_CLINIC_OR_DEPARTMENT_OTHER)
Admission: EM | Admit: 2021-05-27 | Discharge: 2021-05-27 | Disposition: A | Discharge status: Home / Self Care | Payer: 59 | Attending: Emergency Medicine | Admitting: Emergency Medicine

## 2021-05-27 DIAGNOSIS — M549 Dorsalgia, unspecified: Secondary | ICD-10-CM | POA: Insufficient documentation

## 2021-05-27 DIAGNOSIS — Z853 Personal history of malignant neoplasm of breast: Secondary | ICD-10-CM | POA: Diagnosis not present

## 2021-05-27 DIAGNOSIS — M542 Cervicalgia: Secondary | ICD-10-CM | POA: Diagnosis present

## 2021-05-27 DIAGNOSIS — M62838 Other muscle spasm: Secondary | ICD-10-CM | POA: Insufficient documentation

## 2021-05-27 MED ORDER — METHOCARBAMOL 500 MG PO TABS: 500.0000 mg | ORAL_TABLET | Freq: Three times a day (TID) | ORAL | 0 refills | Status: DC | PRN

## 2021-05-27 MED ORDER — NAPROXEN 500 MG PO TABS: 500.0000 mg | ORAL_TABLET | Freq: Two times a day (BID) | ORAL | 0 refills | Status: AC

## 2021-05-27 MED ORDER — METHOCARBAMOL 1000 MG/10ML IJ SOLN
1000.0000 mg | Freq: Once | INTRAMUSCULAR | Status: DC
  Filled 2021-05-27: qty 10

## 2021-05-27 MED ORDER — METHOCARBAMOL 500 MG PO TABS
1000.0000 mg | ORAL_TABLET | Freq: Once | ORAL | Status: AC
  Administered 2021-05-27: 1000 mg via ORAL
  Filled 2021-05-27: qty 2

## 2021-05-27 MED ORDER — KETOROLAC TROMETHAMINE 15 MG/ML IJ SOLN
15.0000 mg | Freq: Once | INTRAMUSCULAR | Status: AC
  Administered 2021-05-27: 15 mg via INTRAMUSCULAR
  Filled 2021-05-27: qty 1

## 2021-05-27 NOTE — ED Triage Notes (Signed)
Posterior neck pain, started yesterday when she woke up. Denies injury. Taking tylenol for pain. No numbness or tingling.

## 2021-05-27 NOTE — ED Provider Notes (Signed)
[facility] Emergency Department Provider Note MRN:  [id number]  Arrival date & time: 05/27/21     Chief Complaint   Neck Pain   History of Present Illness   Tiffany Henry is a 60 y.o. year-old female with a history of breast cancer presenting to the ED with chief complaint of neck pain.  Patient woke up with neck pain yesterday morning, base of the back of the neck.  Felt like a soreness.  Got worse and worse during the day, now cannot move the neck without severe pain.  Cannot lie down, cannot get comfortable.  Not responding to home Tylenol.  Denies any trauma, no headache, no vision change, no numbness or weakness to the arms or legs, no bowel or bladder dysfunction.  No chest pain or shortness of breath, no abdominal pain.  Symptoms are constant, no other exacerbating or alleviating factors.  Review of Systems  A complete 10 system review of systems was obtained and all systems are negative except as noted in the HPI and PMH.   Patient's Health History    Past Medical History:  Diagnosis Date   Allergy    sulfates   Cancer [facility])     Past Surgical History:  Procedure Laterality Date   BREAST SURGERY  08/26/14   lumpectomy    No family history on file.  Social History   Socioeconomic History   Marital status: Married    Spouse name: Not on file   Number of children: Not on file   Years of education: Not on file   Highest education level: Not on file  Occupational History   Not on file  Tobacco Use   Smoking status: Never   Smokeless tobacco: Never  Substance and Sexual Activity   Alcohol use: Not Currently   Drug use: Not on file   Sexual activity: Not on file  Other Topics Concern   Not on file  Social History Narrative   Not on file   Social Determinants of Health   Financial Resource Strain: Not on file  Food Insecurity: Not on file  Transportation Needs: Not on file  Physical Activity: Not on file  Stress: Not on file   Social Connections: Not on file  Intimate Partner Violence: Not on file     Physical Exam   Vitals:   05/27/21 0408  BP: (!) 159/89  Pulse: 73  Resp: 16  Temp: 98.7 F (37.1 C)  SpO2: 98%    CONSTITUTIONAL: Well-appearing, NAD NEURO:  Alert and oriented x 3, no focal deficits EYES:  eyes equal and reactive ENT/NECK:  no LAD, no JVD CARDIO: Regular rate, well-perfused, normal S1 and S2 PULM:  CTAB no wheezing or rhonchi GI/GU:  normal bowel sounds, non-distended, non-tender MSK/SPINE:  No gross deformities, no edema, tenderness to the paraspinal muscles of the cervical spine up to the base of the skull/occiput SKIN:  no rash, atraumatic PSYCH:  Appropriate speech and behavior  *Additional and/or pertinent findings included in MDM below  Diagnostic and Interventional Summary    EKG Interpretation  Date/Time:    Ventricular Rate:    PR Interval:    QRS Duration:   QT Interval:    QTC Calculation:   R Axis:     Text Interpretation:          Labs Reviewed - No data to display  No orders to display    Medications  ketorolac (TORADOL) 15 MG/ML injection 15 mg (15 mg Intramuscular  Given 05/27/21 0427)  methocarbamol (ROBAXIN) tablet 1,000 mg (1,000 mg Oral Given 05/27/21 0427)     Procedures  /  Critical Care Procedures  ED Course and Medical Decision Making  I have reviewed the triage vital signs, the nursing notes, and pertinent available records from the EMR.  Listed above are laboratory and imaging tests that I personally ordered, reviewed, and interpreted and then considered in my medical decision making (see below for details).  History and exam consistent with muscle sprain or spasm.  No trauma, no neurological symptoms, no indication for imaging at this time.  Will attempt symptomatic management and reassess.     Patient doing much better after medications, appropriate for discharge with symptomatic management at home.  Barth Kirks. [place]onia Small, [place] [email or web address]  Final Clinical Impressions(s) / ED Diagnoses     ICD-10-CM   1. Muscle spasms of neck  M62.838       ED Discharge Orders          Ordered    methocarbamol (ROBAXIN) 500 MG tablet  Every 8 hours PRN        05/27/21 0542    naproxen (NAPROSYN) 500 MG tablet  2 times daily        05/27/21 0542             Discharge Instructions Discussed with and Provided to Patient:     Discharge Instructions      You were evaluated in the Emergency Department and after careful evaluation, we did not find any emergent condition requiring admission or further testing in the hospital.  Your exam/testing today is overall reassuring.  Symptoms seem to be due to muscle strain or spasm of the neck.  Recommend using the Naprosyn anti-inflammatory twice daily for pain.  Also recommend the Robaxin muscle relaxer as needed for more significant pain.  Please return to the Emergency Department if you experience any worsening of your condition.   Thank you for allowing [place] to be a part of your care.        Maudie Flakes, MD 05/27/21 [phone]

## 2021-05-27 NOTE — Discharge Instructions (Addendum)
You were evaluated in the Emergency Department and after careful evaluation, we did not find any emergent condition requiring admission or further testing in the hospital.  Your exam/testing today is overall reassuring.  Symptoms seem to be due to muscle strain or spasm of the neck.  Recommend using the Naprosyn anti-inflammatory twice daily for pain.  Also recommend the Robaxin muscle relaxer as needed for more significant pain.  Please return to the Emergency Department if you experience any worsening of your condition.   Thank you for allowing [place] to be a part of your care.

## 2022-07-03 ENCOUNTER — Emergency Department (HOSPITAL_BASED_OUTPATIENT_CLINIC_OR_DEPARTMENT_OTHER): Payer: Medicaid Other

## 2022-07-03 ENCOUNTER — Other Ambulatory Visit: Payer: Self-pay

## 2022-07-03 ENCOUNTER — Emergency Department (HOSPITAL_BASED_OUTPATIENT_CLINIC_OR_DEPARTMENT_OTHER)
Admission: EM | Admit: 2022-07-03 | Discharge: 2022-07-04 | Disposition: A | Discharge status: Home / Self Care | Payer: Medicaid Other | Attending: Emergency Medicine | Admitting: Emergency Medicine

## 2022-07-03 ENCOUNTER — Encounter (HOSPITAL_BASED_OUTPATIENT_CLINIC_OR_DEPARTMENT_OTHER): Payer: Self-pay | Admitting: Emergency Medicine

## 2022-07-03 DIAGNOSIS — Y9301 Activity, walking, marching and hiking: Secondary | ICD-10-CM | POA: Insufficient documentation

## 2022-07-03 DIAGNOSIS — Z7984 Long term (current) use of oral hypoglycemic drugs: Secondary | ICD-10-CM | POA: Diagnosis not present

## 2022-07-03 DIAGNOSIS — W1842XA Slipping, tripping and stumbling without falling due to stepping into hole or opening, initial encounter: Secondary | ICD-10-CM | POA: Diagnosis not present

## 2022-07-03 DIAGNOSIS — S99912A Unspecified injury of left ankle, initial encounter: Secondary | ICD-10-CM | POA: Diagnosis present

## 2022-07-03 DIAGNOSIS — S93492A Sprain of other ligament of left ankle, initial encounter: Secondary | ICD-10-CM

## 2022-07-03 DIAGNOSIS — Y92194 Driveway of other specified residential institution as the place of occurrence of the external cause: Secondary | ICD-10-CM | POA: Diagnosis not present

## 2022-07-03 DIAGNOSIS — Z853 Personal history of malignant neoplasm of breast: Secondary | ICD-10-CM | POA: Diagnosis not present

## 2022-07-03 NOTE — ED Triage Notes (Signed)
Pt reports stepping in a hole and turning her left ankle. Occurred around 3 pm. Swelling and bruising present.

## 2022-07-03 NOTE — ED Notes (Signed)
Patient transported to X-ray 

## 2022-07-04 MED ORDER — ACETAMINOPHEN 325 MG PO TABS: 650.0000 mg | ORAL_TABLET | Freq: Four times a day (QID) | ORAL | 0 refills | Status: AC | PRN

## 2022-07-04 MED ORDER — IBUPROFEN 800 MG PO TABS
800.0000 mg | ORAL_TABLET | Freq: Once | ORAL | Status: AC
  Administered 2022-07-04: 800 mg via ORAL
  Filled 2022-07-04: qty 1

## 2022-07-04 MED ORDER — IBUPROFEN 600 MG PO TABS: 600.0000 mg | ORAL_TABLET | Freq: Four times a day (QID) | ORAL | 0 refills | Status: AC | PRN

## 2022-07-04 MED ORDER — OXYCODONE HCL 5 MG PO TABS: 5.0000 mg | ORAL_TABLET | ORAL | 0 refills | Status: AC | PRN

## 2022-07-04 MED ORDER — HYDROCODONE-ACETAMINOPHEN 5-325 MG PO TABS
1.0000 | ORAL_TABLET | Freq: Once | ORAL | Status: AC
  Administered 2022-07-04: 1 via ORAL
  Filled 2022-07-04: qty 1

## 2022-07-04 NOTE — ED Notes (Signed)
Rx x 3 given  Written and verbal inst to pt  Verbalized an understanding  To home with son

## 2022-07-04 NOTE — ED Provider Notes (Signed)
[facility] Provider Note   CSN: [id number] Arrival date & time: 07/03/22  2317     History  Chief Complaint  Patient presents with   Ankle Pain    Tiffany Henry is a 61 y.o. female.  Patient as above with significant medical history as below, including breast cancer status postlumpectomy, who presents to the ED with complaint of left ankle injury.  Patient reports just prior to arrival she was walking down her driveway, she stepped into a hole and fell to the ground.  Reports her left foot/ankle rolled outwards and her weight fell onto it.  No pain to knee or hip on the left side or right side.  No head injury.  No LOC.  No medications prior to arrival.  Unable to weight-bear following the injury.  No history of prior orthopedic intervention to this leg in the past.  No medications prior to arrival.  Normal state of health prior to this injury.     Past Medical History:  Diagnosis Date   Allergy    sulfates   Cancer [facility])     Past Surgical History:  Procedure Laterality Date   BREAST SURGERY  08/26/14   lumpectomy     The history is provided by the patient. No language interpreter was used.  Ankle Pain Associated symptoms: no back pain and no fever        Home Medications Prior to Admission medications   Medication Sig Start Date End Date Taking? Authorizing Provider  acetaminophen (TYLENOL) 325 MG tablet Take 2 tablets (650 mg total) by mouth every 6 (six) hours as needed. 07/04/22  Yes Wynona Dove A, DO  ibuprofen (ADVIL) 600 MG tablet Take 1 tablet (600 mg total) by mouth every 6 (six) hours as needed. 07/04/22  Yes Wynona Dove A, DO  oxyCODONE (ROXICODONE) 5 MG immediate release tablet Take 1 tablet (5 mg total) by mouth every 4 (four) hours as needed for severe pain. 07/04/22  Yes Jeanell Sparrow, DO  alendronate (FOSAMAX) 70 MG tablet Take by mouth. 09/12/18   [provider]  clindamycin (CLEOCIN T) 1 % external solution   11/28/18   [provider]  clobetasol (TEMOVATE) 0.05 % external solution  11/28/18   [provider]  diclofenac (VOLTAREN) 75 MG EC tablet Take 1 tablet (75 mg total) by mouth 2 (two) times daily. 12/16/18   Dene Gentry, MD  empagliflozin (JARDIANCE) 10 MG TABS tablet  10/20/18   [provider]  exemestane (AROMASIN) 25 MG tablet Take by mouth. 10/22/18   [provider]  letrozole ([place]) 2.5 MG tablet TAKE ONE TABLET BY MOUTH ONCE DAILY 08/06/17   [provider]  metFORMIN (GLUCOPHAGE) 1000 MG tablet TAKE 1 TABLET BY MOUTH TWICE DAILY 09/12/18   [provider]  methocarbamol (ROBAXIN) 500 MG tablet Take 1 tablet (500 mg total) by mouth every 8 (eight) hours as needed for muscle spasms. 05/27/21   Maudie Flakes, MD  naproxen (NAPROSYN) 500 MG tablet Take 1 tablet (500 mg total) by mouth 2 (two) times daily. 05/27/21   Maudie Flakes, MD  ondansetron (ZOFRAN-ODT) 8 MG disintegrating tablet Take 8 mg by mouth every 8 (eight) hours as needed for nausea or vomiting.    [provider]  prochlorperazine (COMPAZINE) 10 MG tablet Take 10 mg by mouth every 6 (six) hours as needed for nausea or vomiting.    [provider]      Allergies  Sulfa antibiotics and Tape    Review of Systems   Review of Systems  Constitutional:  Negative for activity change and fever.  HENT:  Negative for facial swelling and trouble swallowing.   Eyes:  Negative for discharge and redness.  Respiratory:  Negative for cough and shortness of breath.   Cardiovascular:  Negative for chest pain and palpitations.  Gastrointestinal:  Negative for abdominal pain and nausea.  Genitourinary:  Negative for dysuria and flank pain.  Musculoskeletal:  Positive for arthralgias, gait problem and joint swelling. Negative for back pain.  Skin:  Negative for pallor and rash.  Neurological:  Negative for syncope and headaches.    Physical Exam Updated Vital  Signs BP 138/82   Pulse 77   Temp 98.4 F (36.9 C) (Oral)   Resp 16   Ht '5\' 4"'$  (1.626 m)   Wt 99.8 kg   SpO2 100%   BMI 37.76 kg/m  Physical Exam Vitals and nursing note reviewed.  Constitutional:      General: She is not in acute distress.    Appearance: Normal appearance. She is not ill-appearing or diaphoretic.  HENT:     Head: Normocephalic and atraumatic. No raccoon eyes, Battle's sign, right periorbital erythema or left periorbital erythema.     Jaw: There is normal jaw occlusion.     Right Ear: External ear normal.     Left Ear: External ear normal.     Nose: Nose normal.     Mouth/Throat:     Mouth: Mucous membranes are moist.  Eyes:     General: No scleral icterus.       Right eye: No discharge.        Left eye: No discharge.  Cardiovascular:     Rate and Rhythm: Normal rate.     Pulses: Normal pulses.  Pulmonary:     Effort: Pulmonary effort is normal. No tachypnea, accessory muscle usage or respiratory distress.  Abdominal:     General: Abdomen is flat. There is no distension.  Musculoskeletal:        General: Normal range of motion.     Cervical back: Normal range of motion.     Right lower leg: No edema.     Left lower leg: No edema.       Legs:       Feet:     Comments: Swelling to lateral malleolus approximately No pain to left sided fibular head, no pain to left knee.  Pain with squeezing of tib-fib mid shaft  Feet:     Comments: Pain to anterior lateral aspect of ankle including left lateral malleolus.  Achilles tendon appears intact left side.  DP pulses intact bilateral.  Incision intact bilateral feet.  No laceration to left ankle.  Limited range of motion left ankle secondary to discomfort. Skin:    General: Skin is warm and dry.     Capillary Refill: Capillary refill takes less than 2 seconds.  Neurological:     Mental Status: She is alert and oriented to person, place, and time.     GCS: GCS eye subscore is 4. GCS verbal subscore is 5. GCS  motor subscore is 6.  Psychiatric:        Mood and Affect: Mood normal.        Behavior: Behavior normal.     ED Results / Procedures / Treatments   Labs (all labs ordered are listed, but only abnormal results are displayed) Labs Reviewed - No data to  display  EKG None  Radiology DG Ankle Complete Left  Result Date: 07/03/2022 CLINICAL DATA:  Left ankle injury, rule out fracture. EXAM: LEFT ANKLE COMPLETE - 3+ VIEW COMPARISON:  None Available. FINDINGS: There is no evidence of fracture, dislocation, or joint effusion. Plantar calcaneal spurring. Soft tissue swelling about the lateral malleolus. IMPRESSION: 1.  No evidence of fracture or dislocation. 2. Soft tissue swelling about the lateral malleolus concerning for ligamentous sprain/injury. Electronically Signed   By: Keane Police D.O.   On: 07/03/2022 23:58    Procedures Procedures    Medications Ordered in ED Medications  HYDROcodone-acetaminophen (NORCO/VICODIN) 5-325 MG per tablet 1 tablet (1 tablet Oral Given 07/04/22 0021)  ibuprofen (ADVIL) tablet 800 mg (800 mg Oral Given 07/04/22 0021)    ED Course/ Medical Decision Making/ A&P                           Medical Decision Making Amount and/or Complexity of Data Reviewed Radiology: ordered.  Risk OTC drugs. Prescription drug management.    CC: Left ankle injury  This patient presents to the Emergency Department for the above complaint. This involves an extensive number of treatment options and is a complaint that carries with it a high risk of complications and morbidity. Vital signs were reviewed. Serious etiologies considered.  DDx includes not limited to low ankle sprain, ankle sprain, fracture, MSK, soft tissue injury, other acute etiologies considered  Record review:  Previous records obtained and reviewed prior office visits, PDMP, prior labs and imaging, medications  Additional history obtained from N/A  Medical and surgical history as noted above.    Work up as above, notable for:  Labs & imaging results that were available during my care of the patient were visualized by me and considered in my medical decision making.  Physical exam as above.   I ordered imaging studies which included left ankle. I visualized the imaging, interpreted images, and I agree with radiologist interpretation.  No fracture, ? Ligamentous injury  Cardiac monitoring reviewed and interpreted personally which shows n/a   Personally discussed patient care with consultant; n/a  Management: Norco, Motrin Cam boot  ED Course:    I have high suspicion for syndesmosis injury of the left ankle.  She has pain with squeeze test of the tib-fib midshaft.  Significant swelling and discomfort to lateral malleolus and anterior aspect of left ankle.  No fracture on imaging.  Patient unable to bear weight.  Possibly low ankle sprain but suspicion is elevated for a high ankle sprain.  Keep patient CAM Walker, analgesics, have her follow-up orthopedics.  Son will pick her up to take her home.  She has crutches at home. Anticipatory guidance provided. Given o/p ortho f/u.   Reassessment:  Symptoms improved  Admission was considered.     The patient improved significantly and was discharged in stable condition. Detailed discussions were had with the patient regarding current findings, and need for close f/u with PCP or on call doctor. The patient has been instructed to return immediately if the symptoms worsen in any way for re-evaluation. Patient verbalized understanding and is in agreement with current care plan. All questions answered prior to discharge.             Social determinants of health include -  Social History   Socioeconomic History   Marital status: Married    Spouse name: Not on file   Number of children: Not on  file   Years of education: Not on file   Highest education level: Not on file  Occupational History   Not on file  Tobacco Use    Smoking status: Never   Smokeless tobacco: Never  Substance and Sexual Activity   Alcohol use: Not Currently   Drug use: Not on file   Sexual activity: Not on file  Other Topics Concern   Not on file  Social History Narrative   Not on file   Social Determinants of Health   Financial Resource Strain: Not on file  Food Insecurity: Not on file  Transportation Needs: Not on file  Physical Activity: Not on file  Stress: Not on file  Social Connections: Not on file  Intimate Partner Violence: Not on file      This chart was dictated using voice recognition software.  Despite best efforts to proofread,  errors can occur which can change the documentation meaning.         Final Clinical Impression(s) / ED Diagnoses Final diagnoses:  High ankle sprain of left lower extremity, initial encounter    Rx / DC Orders ED Discharge Orders          Ordered    acetaminophen (TYLENOL) 325 MG tablet  Every 6 hours PRN        07/04/22 0017    ibuprofen (ADVIL) 600 MG tablet  Every 6 hours PRN        07/04/22 0017    oxyCODONE (ROXICODONE) 5 MG immediate release tablet  Every 4 hours PRN        07/04/22 0017              Jeanell Sparrow, DO 07/04/22 5146

## 2022-07-04 NOTE — Discharge Instructions (Addendum)
Nonweightbearing to left ankle for the next 2 weeks  Please follow up with PCP and with orthopedics   It was a pleasure caring for you today in the emergency department.  Please return to the emergency department for any worsening or worrisome symptoms.

## 2022-07-04 NOTE — ED Notes (Signed)
ED Provider at bedside. 

## 2022-07-23 ENCOUNTER — Ambulatory Visit (INDEPENDENT_AMBULATORY_CARE_PROVIDER_SITE_OTHER): Payer: Medicaid Other | Admitting: Orthopaedic Surgery

## 2022-07-23 ENCOUNTER — Ambulatory Visit (INDEPENDENT_AMBULATORY_CARE_PROVIDER_SITE_OTHER): Payer: Medicaid Other

## 2022-07-23 DIAGNOSIS — M25572 Pain in left ankle and joints of left foot: Secondary | ICD-10-CM | POA: Diagnosis not present

## 2022-07-23 NOTE — Progress Notes (Signed)
Chief Complaint: let ankle sprain     History of Present Illness:    Tiffany Henry is a 61 y.o. female presents with left ankle pain and swelling after a fall on her neighbors driveway. She went to the ED and was placed in a CAM boot. She  has had difficulty placing weight on the leg since that time. She is here today for further assessment    Surgical History:   none  PMH/PSH/Family History/Social History/Meds/Allergies:    Past Medical History:  Diagnosis Date   Allergy    sulfates   Cancer [facility])    Past Surgical History:  Procedure Laterality Date   BREAST SURGERY  08/26/14   lumpectomy   Social History   Socioeconomic History   Marital status: Married    Spouse name: Not on file   Number of children: Not on file   Years of education: Not on file   Highest education level: Not on file  Occupational History   Not on file  Tobacco Use   Smoking status: Never   Smokeless tobacco: Never  Substance and Sexual Activity   Alcohol use: Not Currently   Drug use: Not on file   Sexual activity: Not on file  Other Topics Concern   Not on file  Social History Narrative   Not on file   Social Determinants of Health   Financial Resource Strain: Not on file  Food Insecurity: Not on file  Transportation Needs: Not on file  Physical Activity: Not on file  Stress: Not on file  Social Connections: Not on file   No family history on file. Allergies  Allergen Reactions   Sulfa Antibiotics    Tape     Can use paper tape   Current Outpatient Medications  Medication Sig Dispense Refill   acetaminophen (TYLENOL) 325 MG tablet Take 2 tablets (650 mg total) by mouth every 6 (six) hours as needed. 36 tablet 0   alendronate (FOSAMAX) 70 MG tablet Take by mouth.     clindamycin (CLEOCIN T) 1 % external solution      clobetasol (TEMOVATE) 0.05 % external solution      diclofenac (VOLTAREN) 75 MG EC tablet Take 1 tablet (75 mg total) by  mouth 2 (two) times daily. 60 tablet 1   empagliflozin (JARDIANCE) 10 MG TABS tablet      exemestane (AROMASIN) 25 MG tablet Take by mouth.     ibuprofen (ADVIL) 600 MG tablet Take 1 tablet (600 mg total) by mouth every 6 (six) hours as needed. 30 tablet 0   letrozole ([place]) 2.5 MG tablet TAKE ONE TABLET BY MOUTH ONCE DAILY     metFORMIN (GLUCOPHAGE) 1000 MG tablet TAKE 1 TABLET BY MOUTH TWICE DAILY     methocarbamol (ROBAXIN) 500 MG tablet Take 1 tablet (500 mg total) by mouth every 8 (eight) hours as needed for muscle spasms. 30 tablet 0   naproxen (NAPROSYN) 500 MG tablet Take 1 tablet (500 mg total) by mouth 2 (two) times daily. 30 tablet 0   ondansetron (ZOFRAN-ODT) 8 MG disintegrating tablet Take 8 mg by mouth every 8 (eight) hours as needed for nausea or vomiting.     oxyCODONE (ROXICODONE) 5 MG immediate release tablet Take 1 tablet (5 mg total) by mouth every 4 (four) hours as needed for  severe pain. 14 tablet 0   prochlorperazine (COMPAZINE) 10 MG tablet Take 10 mg by mouth every 6 (six) hours as needed for nausea or vomiting.     No current facility-administered medications for this visit.   No results found.  Review of Systems:   A ROS was performed including pertinent positives and negatives as documented in the HPI.  Physical Exam :   Constitutional: NAD and appears stated age Neurological: Alert and oriented Psych: Appropriate affect and cooperative There were no vitals taken for this visit.   Comprehensive Musculoskeletal Exam:     Right Left  Gait antalgic  Musculoskeletal Exam    Deformity No deformity No deformity  Tenderness none Mid tib/fib with positive squeeze  Skin    Abrasions None None  Blisters None None  Range of Motion    Ankle Dorsiflexion 20 0  Ankle Plantarflexion 30 0  Subtalar Joint Inversion/Eversion full Not tested  Stability    Dislocations None None  Subluxations or Laxity None None  Muscle Strength    Single Heel Raise Able Able   Sensation    Sural Nerve Dist. Normal Normal  Saphenous Nerve Dist. Normal Normal  Tibial Nerve Dist. Normal Normal  Deep Peroneal Nerve Dist. Normal Normal  Superficial Peroneal Nerve Dist. Normal Normal  Cardiovascular     Varicosites None  None  DP Artery Pulse Palpable Palpable  Capillary Refill <2 sec <2 sec  Special Tests:       Imaging:   Xray (2 views left tibia/fibula): normal    I personally reviewed and interpreted the radiographs.   Assessment:   61 y.o. female with a left high ankle sprain. At this time she my be activity as tolerated and I would like her to progress as she is able. She will likely need her boot for 2 additional weeks. I would like her to participate in PT for aquatic therapy to reestablish her gait.  Plan :    - Return to clinic in 1 month     I personally saw and evaluated the patient, and participated in the management and treatment plan.  Vanetta Mulders, MD Attending Physician, Orthopedic Surgery  This document was dictated using Dragon voice recognition software. A reasonable attempt at proof reading has been made to minimize errors.

## 2022-08-20 ENCOUNTER — Ambulatory Visit (HOSPITAL_BASED_OUTPATIENT_CLINIC_OR_DEPARTMENT_OTHER): Payer: Medicaid Other | Admitting: Orthopaedic Surgery

## 2023-03-04 ENCOUNTER — Emergency Department (HOSPITAL_BASED_OUTPATIENT_CLINIC_OR_DEPARTMENT_OTHER): Payer: BC Managed Care – PPO

## 2023-03-04 ENCOUNTER — Emergency Department (HOSPITAL_BASED_OUTPATIENT_CLINIC_OR_DEPARTMENT_OTHER)
Admission: EM | Admit: 2023-03-04 | Discharge: 2023-03-04 | Disposition: A | Discharge status: Home / Self Care | Payer: BC Managed Care – PPO | Attending: Emergency Medicine | Admitting: Emergency Medicine

## 2023-03-04 ENCOUNTER — Encounter (HOSPITAL_BASED_OUTPATIENT_CLINIC_OR_DEPARTMENT_OTHER): Payer: Self-pay | Admitting: Urology

## 2023-03-04 ENCOUNTER — Other Ambulatory Visit: Payer: Self-pay

## 2023-03-04 DIAGNOSIS — Z79899 Other long term (current) drug therapy: Secondary | ICD-10-CM | POA: Diagnosis not present

## 2023-03-04 DIAGNOSIS — M436 Torticollis: Secondary | ICD-10-CM | POA: Diagnosis not present

## 2023-03-04 DIAGNOSIS — M542 Cervicalgia: Secondary | ICD-10-CM | POA: Diagnosis present

## 2023-03-04 DIAGNOSIS — Z7984 Long term (current) use of oral hypoglycemic drugs: Secondary | ICD-10-CM | POA: Diagnosis not present

## 2023-03-04 MED ORDER — METHOCARBAMOL 500 MG PO TABS: 500.0000 mg | ORAL_TABLET | Freq: Three times a day (TID) | ORAL | 0 refills | Status: AC | PRN

## 2023-03-04 MED ORDER — HYDROMORPHONE HCL 1 MG/ML IJ SOLN
0.5000 mg | Freq: Once | INTRAMUSCULAR | Status: AC
  Administered 2023-03-04: 0.5 mg via INTRAVENOUS
  Filled 2023-03-04: qty 1

## 2023-03-04 MED ORDER — KETOROLAC TROMETHAMINE 30 MG/ML IJ SOLN
30.0000 mg | Freq: Once | INTRAMUSCULAR | Status: AC
  Administered 2023-03-04: 30 mg via INTRAVENOUS
  Filled 2023-03-04: qty 1

## 2023-03-04 MED ORDER — OXYCODONE-ACETAMINOPHEN 5-325 MG PO TABS: 1.0000 | ORAL_TABLET | Freq: Four times a day (QID) | ORAL | 0 refills | Status: AC | PRN

## 2023-03-04 NOTE — ED Provider Notes (Signed)
[facility] Provider Note   CSN: [id number] Arrival date & time: 03/04/23  1813     History {Add pertinent medical, surgical, social history, OB history to HPI:1} Chief Complaint  Patient presents with   Neck Pain    Tiffany Henry is a 62 y.o. female.  She is here with a complaint of posterior neck pain that has been going on for 4 days.  She said it started fairly mild the first few days but now is intense and worse with any type of movement.  No difficulty speaking or swallowing no blurry vision numbness or weakness.  No known trauma.  She said she had this happen to her about a year ago and improved with muscle relaxants.  She has tried muscle relaxants ibuprofen acetaminophen warm compresses without any improvement.  She does have a remote history of breast cancer.  The history is provided by the patient.  Neck Pain Pain location:  Occipital region Quality:  Stabbing Pain severity:  Severe Pain is:  Same all the time Onset quality:  Gradual Duration:  4 days Timing:  Constant Progression:  Worsening Chronicity:  Recurrent Context: not recent injury   Relieved by:  Nothing Worsened by:  Bending and twisting Ineffective treatments:  Analgesics, heat, muscle relaxants and NSAIDs Associated symptoms: no bladder incontinence, no bowel incontinence, no chest pain, no fever, no numbness, no visual change and no weakness        Home Medications Prior to Admission medications   Medication Sig Start Date End Date Taking? Authorizing Provider  acetaminophen (TYLENOL) 325 MG tablet Take 2 tablets (650 mg total) by mouth every 6 (six) hours as needed. 07/04/22   Jeanell Sparrow, DO  alendronate (FOSAMAX) 70 MG tablet Take by mouth. 09/12/18   [provider]  clindamycin (CLEOCIN T) 1 % external solution  11/28/18   [provider]  clobetasol (TEMOVATE) 0.05 % external solution  11/28/18   [provider]   diclofenac (VOLTAREN) 75 MG EC tablet Take 1 tablet (75 mg total) by mouth 2 (two) times daily. 12/16/18   Dene Gentry, MD  empagliflozin (JARDIANCE) 10 MG TABS tablet  10/20/18   [provider]  exemestane (AROMASIN) 25 MG tablet Take by mouth. 10/22/18   [provider]  ibuprofen (ADVIL) 600 MG tablet Take 1 tablet (600 mg total) by mouth every 6 (six) hours as needed. 07/04/22   Jeanell Sparrow, DO  letrozole ([place]) 2.5 MG tablet TAKE ONE TABLET BY MOUTH ONCE DAILY 08/06/17   [provider]  metFORMIN (GLUCOPHAGE) 1000 MG tablet TAKE 1 TABLET BY MOUTH TWICE DAILY 09/12/18   [provider]  methocarbamol (ROBAXIN) 500 MG tablet Take 1 tablet (500 mg total) by mouth every 8 (eight) hours as needed for muscle spasms. 05/27/21   Maudie Flakes, MD  naproxen (NAPROSYN) 500 MG tablet Take 1 tablet (500 mg total) by mouth 2 (two) times daily. 05/27/21   Maudie Flakes, MD  ondansetron (ZOFRAN-ODT) 8 MG disintegrating tablet Take 8 mg by mouth every 8 (eight) hours as needed for nausea or vomiting.    [provider]  oxyCODONE (ROXICODONE) 5 MG immediate release tablet Take 1 tablet (5 mg total) by mouth every 4 (four) hours as needed for severe pain. 07/04/22   Jeanell Sparrow, DO  prochlorperazine (COMPAZINE) 10 MG tablet Take 10 mg by mouth every 6 (six) hours as needed for nausea or vomiting.  [provider]      Allergies    Sulfa antibiotics and Tape    Review of Systems   Review of Systems  Constitutional:  Negative for fever.  HENT:  Negative for sore throat.   Eyes:  Negative for visual disturbance.  Respiratory:  Negative for shortness of breath.   Cardiovascular:  Negative for chest pain.  Gastrointestinal:  Negative for bowel incontinence.  Genitourinary:  Negative for bladder incontinence.  Musculoskeletal:  Positive for neck pain.  Skin:  Negative for rash.  Neurological:  Negative for weakness and numbness.     Physical Exam Updated Vital Signs BP (!) 162/97 (BP Location: Right Arm)   Pulse (!) 102   Temp 99 F (37.2 C)   Resp 20   Ht 5\' 4"  (1.626 m)   Wt 99.8 kg   SpO2 100%   BMI 37.77 kg/m  Physical Exam Vitals and nursing note reviewed.  Constitutional:      General: She is not in acute distress.    Appearance: Normal appearance. She is well-developed.  HENT:     Head: Normocephalic and atraumatic.  Eyes:     Conjunctiva/sclera: Conjunctivae normal.  Neck:     Comments: She has diffuse tenderness on her right and left paracervical musculature but no midline tenderness and no step-offs. Cardiovascular:     Rate and Rhythm: Normal rate and regular rhythm.     Heart sounds: No murmur heard. Pulmonary:     Effort: Pulmonary effort is normal. No respiratory distress.     Breath sounds: Normal breath sounds.  Abdominal:     Palpations: Abdomen is soft.     Tenderness: There is no abdominal tenderness.  Musculoskeletal:        General: No deformity.     Cervical back: Tenderness present.  Skin:    General: Skin is warm and dry.     Capillary Refill: Capillary refill takes less than 2 seconds.  Neurological:     Mental Status: She is alert.     Cranial Nerves: No cranial nerve deficit.     Sensory: No sensory deficit.     Motor: No weakness.     ED Results / Procedures / Treatments   Labs (all labs ordered are listed, but only abnormal results are displayed) Labs Reviewed - No data to display  EKG None  Radiology No results found.  Procedures Procedures  {Document cardiac monitor, telemetry assessment procedure when appropriate:1}  Medications Ordered in ED Medications  ketorolac (TORADOL) 30 MG/ML injection 30 mg (has no administration in time range)  HYDROmorphone (DILAUDID) injection 0.5 mg (has no administration in time range)    ED Course/ Medical Decision Making/ A&P   {   Click here for ABCD2, HEART and other calculatorsREFRESH Note before  signing :1}                          Medical Decision Making Amount and/or Complexity of Data Reviewed Radiology: ordered.  Risk Prescription drug management.   This patient complains of ***; this involves an extensive number of treatment Options and is a complaint that carries with it a high risk of complications and morbidity. The differential includes ***  I ordered, reviewed and interpreted labs, which included *** I ordered medication *** and reviewed PMP when indicated. I ordered imaging studies which included *** and I independently    visualized and interpreted imaging which showed *** Additional history obtained from ***  Previous records obtained and reviewed *** I consulted *** and discussed lab and imaging findings and discussed disposition.  Cardiac monitoring reviewed, *** Social determinants considered, *** Critical Interventions: ***  After the interventions stated above, I reevaluated the patient and found *** Admission and further testing considered, ***   {Document critical care time when appropriate:1} {Document review of labs and clinical decision tools ie heart score, Chads2Vasc2 etc:1}  {Document your independent review of radiology images, and any outside records:1} {Document your discussion with family members, caretakers, and with consultants:1} {Document social determinants of health affecting pt's care:1} {Document your decision making why or why not admission, treatments were needed:1} Final Clinical Impression(s) / ED Diagnoses Final diagnoses:  None    Rx / DC Orders ED Discharge Orders     None

## 2023-03-04 NOTE — Discharge Instructions (Signed)
You are seen in the emergency department for neck pain.  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  Please continue ibuprofen 3 times a day.  We are prescribing you a muscle relaxant and a pain pill.  Please use caution with these medications.  Contact your primary care doctor for close follow-up.  Radiology is recommending a possible MRI for further evaluation.

## 2023-03-04 NOTE — ED Triage Notes (Signed)
Posterior neck pain that started Sunday as a crick in the neck, now worse,  Muscle relaxer at 1000 with no relief  Denies fever at home  No known injury, limited ROM due to pain

## 2024-03-07 ENCOUNTER — Other Ambulatory Visit (HOSPITAL_BASED_OUTPATIENT_CLINIC_OR_DEPARTMENT_OTHER): Payer: Self-pay

## 2024-03-07 MED ORDER — MOUNJARO 2.5 MG/0.5ML ~~LOC~~ SOAJ
2.5000 mg | SUBCUTANEOUS | 0 refills | Status: AC
  Filled 2024-03-07: qty 2, 28d supply, fill #0

## 2024-03-08 ENCOUNTER — Other Ambulatory Visit (HOSPITAL_BASED_OUTPATIENT_CLINIC_OR_DEPARTMENT_OTHER): Payer: Self-pay

## 2024-03-09 ENCOUNTER — Other Ambulatory Visit (HOSPITAL_BASED_OUTPATIENT_CLINIC_OR_DEPARTMENT_OTHER): Payer: Self-pay

## 2024-03-10 ENCOUNTER — Other Ambulatory Visit (HOSPITAL_BASED_OUTPATIENT_CLINIC_OR_DEPARTMENT_OTHER): Payer: Self-pay

## 2024-03-11 ENCOUNTER — Other Ambulatory Visit (HOSPITAL_BASED_OUTPATIENT_CLINIC_OR_DEPARTMENT_OTHER): Payer: Self-pay

## 2024-03-14 ENCOUNTER — Other Ambulatory Visit (HOSPITAL_BASED_OUTPATIENT_CLINIC_OR_DEPARTMENT_OTHER): Payer: Self-pay

## 2024-03-15 ENCOUNTER — Other Ambulatory Visit (HOSPITAL_BASED_OUTPATIENT_CLINIC_OR_DEPARTMENT_OTHER): Payer: Self-pay

## 2024-03-16 ENCOUNTER — Other Ambulatory Visit (HOSPITAL_BASED_OUTPATIENT_CLINIC_OR_DEPARTMENT_OTHER): Payer: Self-pay

## 2024-03-17 ENCOUNTER — Other Ambulatory Visit (HOSPITAL_BASED_OUTPATIENT_CLINIC_OR_DEPARTMENT_OTHER): Payer: Self-pay

## 2024-03-18 ENCOUNTER — Other Ambulatory Visit (HOSPITAL_BASED_OUTPATIENT_CLINIC_OR_DEPARTMENT_OTHER): Payer: Self-pay

## 2024-03-21 ENCOUNTER — Other Ambulatory Visit (HOSPITAL_BASED_OUTPATIENT_CLINIC_OR_DEPARTMENT_OTHER): Payer: Self-pay

## 2024-03-22 ENCOUNTER — Other Ambulatory Visit (HOSPITAL_BASED_OUTPATIENT_CLINIC_OR_DEPARTMENT_OTHER): Payer: Self-pay

## 2024-03-23 ENCOUNTER — Other Ambulatory Visit (HOSPITAL_BASED_OUTPATIENT_CLINIC_OR_DEPARTMENT_OTHER): Payer: Self-pay

## 2024-03-24 ENCOUNTER — Other Ambulatory Visit (HOSPITAL_BASED_OUTPATIENT_CLINIC_OR_DEPARTMENT_OTHER): Payer: Self-pay

## 2024-03-25 ENCOUNTER — Other Ambulatory Visit (HOSPITAL_BASED_OUTPATIENT_CLINIC_OR_DEPARTMENT_OTHER): Payer: Self-pay

## 2024-03-28 ENCOUNTER — Other Ambulatory Visit (HOSPITAL_BASED_OUTPATIENT_CLINIC_OR_DEPARTMENT_OTHER): Payer: Self-pay

## 2024-03-29 ENCOUNTER — Other Ambulatory Visit (HOSPITAL_BASED_OUTPATIENT_CLINIC_OR_DEPARTMENT_OTHER): Payer: Self-pay

## 2024-03-30 ENCOUNTER — Other Ambulatory Visit (HOSPITAL_BASED_OUTPATIENT_CLINIC_OR_DEPARTMENT_OTHER): Payer: Self-pay

## 2024-03-31 ENCOUNTER — Other Ambulatory Visit (HOSPITAL_BASED_OUTPATIENT_CLINIC_OR_DEPARTMENT_OTHER): Payer: Self-pay

## 2024-04-01 ENCOUNTER — Other Ambulatory Visit (HOSPITAL_BASED_OUTPATIENT_CLINIC_OR_DEPARTMENT_OTHER): Payer: Self-pay

## 2024-04-04 ENCOUNTER — Other Ambulatory Visit (HOSPITAL_BASED_OUTPATIENT_CLINIC_OR_DEPARTMENT_OTHER): Payer: Self-pay

## 2024-04-12 ENCOUNTER — Other Ambulatory Visit (HOSPITAL_BASED_OUTPATIENT_CLINIC_OR_DEPARTMENT_OTHER): Payer: Self-pay

## 2024-04-28 ENCOUNTER — Other Ambulatory Visit (HOSPITAL_BASED_OUTPATIENT_CLINIC_OR_DEPARTMENT_OTHER): Payer: Self-pay

## 2024-05-10 ENCOUNTER — Other Ambulatory Visit (HOSPITAL_BASED_OUTPATIENT_CLINIC_OR_DEPARTMENT_OTHER): Payer: Self-pay

## 2024-05-12 ENCOUNTER — Other Ambulatory Visit (HOSPITAL_BASED_OUTPATIENT_CLINIC_OR_DEPARTMENT_OTHER): Payer: Self-pay
